# Patient Record
Sex: Female | Born: 1940
Health system: Southern US, Community
[De-identification: ages and names within clinical notes are randomized; demographics above are authoritative.]

## PROBLEM LIST (undated history)

## (undated) DIAGNOSIS — Z5189 Encounter for other specified aftercare: Secondary | ICD-10-CM

## (undated) DIAGNOSIS — F329 Major depressive disorder, single episode, unspecified: Secondary | ICD-10-CM

## (undated) DIAGNOSIS — N189 Chronic kidney disease, unspecified: Secondary | ICD-10-CM

## (undated) DIAGNOSIS — D649 Anemia, unspecified: Secondary | ICD-10-CM

## (undated) DIAGNOSIS — M1990 Unspecified osteoarthritis, unspecified site: Secondary | ICD-10-CM

## (undated) DIAGNOSIS — I10 Essential (primary) hypertension: Secondary | ICD-10-CM

## (undated) DIAGNOSIS — T7840XA Allergy, unspecified, initial encounter: Secondary | ICD-10-CM

## (undated) DIAGNOSIS — F32A Depression, unspecified: Secondary | ICD-10-CM

## (undated) DIAGNOSIS — K635 Polyp of colon: Secondary | ICD-10-CM

## (undated) DIAGNOSIS — F419 Anxiety disorder, unspecified: Secondary | ICD-10-CM

## (undated) DIAGNOSIS — J189 Pneumonia, unspecified organism: Secondary | ICD-10-CM

## (undated) DIAGNOSIS — H269 Unspecified cataract: Secondary | ICD-10-CM

## (undated) DIAGNOSIS — R55 Syncope and collapse: Secondary | ICD-10-CM

## (undated) DIAGNOSIS — M5416 Radiculopathy, lumbar region: Secondary | ICD-10-CM

## (undated) DIAGNOSIS — M810 Age-related osteoporosis without current pathological fracture: Secondary | ICD-10-CM

## (undated) DIAGNOSIS — G709 Myoneural disorder, unspecified: Secondary | ICD-10-CM

## (undated) DIAGNOSIS — J302 Other seasonal allergic rhinitis: Secondary | ICD-10-CM

## (undated) DIAGNOSIS — I4510 Unspecified right bundle-branch block: Secondary | ICD-10-CM

## (undated) DIAGNOSIS — K219 Gastro-esophageal reflux disease without esophagitis: Secondary | ICD-10-CM

## (undated) HISTORY — PX: COLONOSCOPY: SHX174

## (undated) HISTORY — DX: Depression, unspecified: F32.A

## (undated) HISTORY — DX: Major depressive disorder, single episode, unspecified: F32.9

## (undated) HISTORY — PX: POLYPECTOMY: SHX149

## (undated) HISTORY — DX: Unspecified osteoarthritis, unspecified site: M19.90

## (undated) HISTORY — DX: Other seasonal allergic rhinitis: J30.2

## (undated) HISTORY — DX: Encounter for other specified aftercare: Z51.89

## (undated) HISTORY — PX: WISDOM TOOTH EXTRACTION: SHX21

## (undated) HISTORY — DX: Polyp of colon: K63.5

## (undated) HISTORY — DX: Syncope and collapse: R55

## (undated) HISTORY — DX: Age-related osteoporosis without current pathological fracture: M81.0

## (undated) HISTORY — DX: Radiculopathy, lumbar region: M54.16

## (undated) HISTORY — DX: Allergy, unspecified, initial encounter: T78.40XA

## (undated) HISTORY — DX: Unspecified right bundle-branch block: I45.10

## (undated) HISTORY — DX: Essential (primary) hypertension: I10

## (undated) HISTORY — DX: Anxiety disorder, unspecified: F41.9

## (undated) HISTORY — DX: Unspecified cataract: H26.9

## (undated) HISTORY — DX: Gastro-esophageal reflux disease without esophagitis: K21.9

---

## 1979-02-15 HISTORY — PX: VAGINAL HYSTERECTOMY: SUR661

## 1991-02-15 HISTORY — PX: RECTOCELE REPAIR: SHX761

## 1997-09-25 ENCOUNTER — Other Ambulatory Visit: Admission: RE | Admit: 1997-09-25 | Discharge: 1997-09-25 | Payer: Self-pay | Admitting: Internal Medicine

## 1998-04-14 ENCOUNTER — Other Ambulatory Visit: Admission: RE | Admit: 1998-04-14 | Discharge: 1998-04-14 | Payer: Self-pay | Admitting: *Deleted

## 2000-06-06 ENCOUNTER — Other Ambulatory Visit: Admission: RE | Admit: 2000-06-06 | Discharge: 2000-06-06 | Payer: Self-pay | Admitting: *Deleted

## 2000-09-23 ENCOUNTER — Emergency Department (HOSPITAL_COMMUNITY): Admission: EM | Admit: 2000-09-23 | Discharge: 2000-09-23 | Payer: Self-pay | Admitting: Emergency Medicine

## 2000-12-26 ENCOUNTER — Ambulatory Visit (HOSPITAL_COMMUNITY): Admission: RE | Admit: 2000-12-26 | Discharge: 2000-12-27 | Payer: Self-pay | Admitting: Cardiology

## 2001-02-14 HISTORY — PX: CARDIAC CATHETERIZATION: SHX172

## 2003-01-08 ENCOUNTER — Encounter: Admission: RE | Admit: 2003-01-08 | Discharge: 2003-01-08 | Payer: Self-pay | Admitting: Internal Medicine

## 2004-02-15 HISTORY — PX: BACK SURGERY: SHX140

## 2004-06-17 ENCOUNTER — Observation Stay (HOSPITAL_COMMUNITY): Admission: RE | Admit: 2004-06-17 | Discharge: 2004-06-18 | Payer: Self-pay | Admitting: Specialist

## 2004-12-25 ENCOUNTER — Emergency Department (HOSPITAL_COMMUNITY): Admission: EM | Admit: 2004-12-25 | Discharge: 2004-12-25 | Payer: Self-pay | Admitting: Emergency Medicine

## 2006-02-14 HISTORY — PX: TOTAL HIP ARTHROPLASTY: SHX124

## 2006-03-09 ENCOUNTER — Ambulatory Visit: Payer: Self-pay | Admitting: Internal Medicine

## 2006-03-14 ENCOUNTER — Ambulatory Visit: Payer: Self-pay | Admitting: Internal Medicine

## 2006-04-24 ENCOUNTER — Encounter: Admission: RE | Admit: 2006-04-24 | Discharge: 2006-04-24 | Payer: Self-pay | Admitting: Specialist

## 2006-06-07 ENCOUNTER — Encounter: Admission: RE | Admit: 2006-06-07 | Discharge: 2006-06-07 | Payer: Self-pay | Admitting: Specialist

## 2006-08-03 ENCOUNTER — Inpatient Hospital Stay (HOSPITAL_COMMUNITY): Admission: RE | Admit: 2006-08-03 | Discharge: 2006-08-09 | Payer: Self-pay | Admitting: Specialist

## 2009-10-14 ENCOUNTER — Ambulatory Visit (HOSPITAL_COMMUNITY): Admission: RE | Admit: 2009-10-14 | Discharge: 2009-10-14 | Payer: Self-pay | Admitting: Orthopedic Surgery

## 2010-06-29 NOTE — Op Note (Signed)
[name], [name]              ACCOUNT NO.:  [id number]   MEDICAL RECORD NO.:  [id number]          PATIENT TYPE:  INP   LOCATION:  0003                         FACILITY:  [facility]   PHYSICIAN:  Jene Every, M.D.    DATE OF BIRTH:  08/08/1940   DATE OF PROCEDURE:  08/03/2006  DATE OF DISCHARGE:                               OPERATIVE REPORT   PREOPERATIVE DIAGNOSIS:  Degenerative joint disease, right hip.   POSTOPERATIVE DIAGNOSIS:  Degenerative joint disease, right hip.   PROCEDURE PERFORMED:  Right total hip arthroplasty.   ANESTHESIA:  General.   ASSISTANT:  Marlowe Kays, M.D.   COMPONENTS:  DePuy Prodigy 12 femur, 52 hip, +9 ball, 10-degree  posterior lip.   BRIEF HISTORY AND INDICATION:  [age], end-stage osteoarthrosis of  the hip, valgus head and neck.  Operative intervention was indicated for  replacement of degenerated joint.  Risks and benefits discussed  including bleeding, infection, dislocation, leg length discrepancy,  dissociation, need for revision, anesthetic complications, etc.   TECHNIQUE:  The patient in supine position.  After induction of adequate  general anesthesia, 2 g Kefzol, the right lower extremity is prepped and  draped in the usual sterile fashion and the peritrochanteric region as  well.  She was prior to that placed in a hip holder frame.   Peritrochanteric incision was made based the head.  Subcutaneous tissue  was dissected.  Electrocautery was utilized to achieve hemostasis.  Fascia lata identified and divided in the line of the skin incision.  We  placed a Charnley retractor with the soft tissue protected with towels.  Adductor tenotomy performed, the hip internally rotated, bursa excised,  external rotators tagged, detached from their origin, reflected  posteriorly to protect the sciatic nerve throughout the case.  A  capsular tenotomy performed, capsule incised, abundant synovial fluid  evacuated.  Hypertrophic synovitis  removed as well.  The hip was  dislocated approximately a fingerbreadth and a half above the lesser  trochanter due to her valgus angle.  An oscillating saw was utilized to  perform an osteotomy of the femoral neck.  We then entered the femoral  canal, sequentially reaming that after using the initiator to a 12 mm  diameter.  It was then broached to a 12.5 after lateralizing it  appropriately in the appropriate version.  Used a calcar reamer as well.  Next, attention turned towards the acetabulum.  We placed retractors  around the acetabulum, removed the labrum, curetted the acetabulum,  identified the fovea, utilized electrocautery.  We used a reamer and  reamed sequentially from a 40 to a 51 to the depth of the fovea,  medializing this in the appropriate version to good bleeding cancellous  tissue.  No perforation of the medial wall was noted.  We used a trial  cup, appropriate version, 45, 15 in inclination.  The trial femur was  reduced and found to be stable throughout a full range.  Used a +5 ball  at that time.  Attention was turned back toward the acetabulum.  Irrigated it copiously.  Used a Duraloc Tri-Lock cup, inserted in the  appropriate fashion, 45 degrees, 15 degrees of inclination, impacted  into place and it was noted to seat flush with the floor of the  acetabulum.  Attempted to move it with the hand and it was not  dislodging.  I put in two additional screws to secure the cup, drilling  superiorly and posteriorly, felt posteriorly, placed a 25 and a 20 screw  with excellent purchase.  We used a 10-degree lip liner in the 10  o'clock position.  Impacted it checked with the [facility].  Attention was  turned back toward the femur, it was irrigated copiously.  I used a 12  Prodigy, impacted it into place, excellent purchase, seated flush with  the calcar in the appropriate version.  We trialled hip balls and +5 and  +9 appeared to be optimal with excellent stability in flexion  and  external rotation without dislocation.  Full extension with the knee  flexed, internally and externally rotated without dislocation.  Wound  copiously irrigated.  Inspection revealed no evidence of active  bleeding.  Electrocautery had been utilized to achieve strict  hemostasis.  Removed the tag retractor.  We examined the sciatic nerve  and it was found to be intact.  Then repaired the adductor tenotomy with  #1 Vicryl interrupted figure-of-eight sutures.  Fascia lata repaired  with #1 Vicryl interrupted figure-of-eight sutures.  Subcutaneous tissue  approximated layers of 0 and 2-0 Vicryl simple sutures.  The skin was  reapproximated with staples and the wound was dressed sterilely.  She  was placed supine on a hospital bed and a knee immobilizer placed.  Leg  lengths were equivalent, good pulses.  Extubated without difficulty and  transported to the recovery room in satisfactory condition.   The patient tolerated the procedure well with no complications.  Estimated blood loss was 900 mL.      Jene Every, M.D.  Electronically Signed     JB/MEDQ  D:  08/03/2006  T:  08/03/2006  Job:  [id number]

## 2010-06-29 NOTE — Discharge Summary (Signed)
[name], [name]              ACCOUNT NO.:  [id number]   MEDICAL RECORD NO.:  [id number]          PATIENT TYPE:  INP   LOCATION:  1605                         FACILITY:  [facility]   PHYSICIAN:  Jene Every, M.D.    DATE OF BIRTH:  August 27, 1940   DATE OF ADMISSION:  08/03/2006  DATE OF DISCHARGE:  08/09/2006                               DISCHARGE SUMMARY   ADMISSION DIAGNOSES:  1. Degenerative joint disease, right hip.  2. Hypertension.  3. Hypokalemia.  4. Anxiety.  5. Depression.   DISCHARGE DIAGNOSES:  1. Degenerative joint disease, right hip, status post right total hip      arthroplasty.  2. Resolved urinary retention.  3. Resolved constipation.  4. Resolved hypokalemia.  5. Resolved postoperative anemia.  6. Hypertension.  7. Anxiety.  8. Depression.   HISTORY:  Ms. [name] is a pleasant 70 year old female with  longstanding history of hip pain that has become disabling for her.  She  underwent conservative treatment and failed. It was thought at this  point she would benefit from a total hip arthroplasty.  The risks and  benefits of this were discussed with the patient.  She elected to  proceed.   PROCEDURE NOTE:  The patient was taken to the OR on August 03, 2006.  Surgeon: Jene Every, M.D.  Assistant:  Marlowe Kays, M.D.  Did  undergo right total hip arthroplasty.  Anesthesia: General.  Complications:  None.   CONSULTATIONS:  PT, OT, case management.   LABORATORY DATA:  Preoperative CBC showed white cell count 4.9,  hemoglobin 12.4, hematocrit 35.8. These were followed throughout  hospital course.  Postoperatively, the patient's hemoglobin did drop to  a low of 8.2, hematocrit 23.8.  She was transfused 2 units packed red  blood cells which did improve.  At time of discharge, hemoglobin 10.9,  hematocrit 31.5.  White cell count remained normal throughout the  hospital stay.  Postoperatively, the patient placed on Coumadin.  Initial coagulation studies  showed PT of 12.7, INR 0.9, PTT 24.  These  were followed throughout the hospital course.  At the time of discharge,  the patient is therapeutic on Coumadin with INR of 2.2.  Routine  chemistries done preoperatively showed sodium 136, potassium 3.8,  glucose 101.  Normal BUN and creatinine.  These were followed throughout  the hospital course.  The patient's sodium remained normal.  Potassium  did decrease to low of 3.2.  At the time of discharge, potassium is 4.0.  Glucose fluctuated to a high of 152.  At the time of discharge, this was  112.  BUN and creatinine have remained normal.  Calcium levels did  fluctuate throughout the hospital course.  Preoperative urinalysis was  negative.  This was repeated on June 21 which was negative as well for  any bacteria or white cells.  Blood type O negative.  Urine cultures  showed no growth.   Preoperative chest x-ray showed no active disease.   Preoperative films of the hip showed advanced degenerative changes of  the right hip.   Preoperative EKG showed normal sinus rhythm, ST and  T wave abnormalities  noted, questionable anterolateral ischemia which was evaluated  appropriately.   HOSPITAL COURSE:  The patient was admitted, taken to the OR, underwent  the above-stated procedure without difficulty.  She was transferred to  PACU and then to the orthopedic floor for continued postoperative care.  Postoperatively, she was placed on PCA for pain relief.  She did fairly  well.  On postoperative day #1, she was started on Coumadin for DVT  prophylaxis.  The patient progressed slowly with physical therapy.  She  had difficulties with voiding. She was started on Flomax.  Unfortunately, a catheter had to be inserted again on postoperative day  #2.  She also had a drop in her hemoglobin on postoperative day #2 to  8.9, hematocrit 25.8.  She had a temperature of 100.8, pulse 105.  Hemoglobin was repeated on postop day #3. She was slightly dizzy and  a  little tachycardic.  Hemoglobin dropped to a low of 8.2.  It was felt at  this point she would benefit from 2 units of packed red blood cells.  Dressing was changed.  The incision was clean and dry.  There was no  erythema or evidence of infection.  Compartments were soft.  She also  had slight hypokalemia on postoperative day #3.  This was supplemented  with p.o. K-Dur.  She was continued on Flomax.  She also had complaints  of constipation and was treated appropriately.   On postoperative day #5, she was voiding without difficulty.  She had a  bowel movement without difficulty as well.  She had some increased  swelling in the right lower extremity, but this was secondary to  increased activity.  She was progressing well with physical therapy.  At  this point, discharge planning was initiated with plans for discharge  tomorrow to assisted living. She did have resolution of her  postoperative anemia as well as hypokalemia.   On postoperative day #6, the patient was doing very well.  She had met  all goals of physical therapy.  It was thought she would be stable to be  discharged to assisted living. She was voiding without difficulty.  Pain  was controlled on p.o. analgesics. She was afebrile.  Vital signs were  stable.  She had normal potassium and sodium.  Incision was clean and  dry with no evidence of infection.   DISPOSITION:  Patient discharged to [facility].   DIET:  As tolerated.   ACTIVITY:  She is to be partial weightbearing on the right lower  extremity.  Total hip precautions should be implemented.  She should use  knee immobilizer when in bed to avoid dislocation.   DISCHARGE MEDICATIONS:  1. Metoprolol ER 50 mg 1/2 tablet p.o. q.a.m.  2. Potassium 20 mEq 1 p.o. b.i.d.  3. Lorazepam 0.5 mg 1 p.o. nightly.  4. Paxil 40 mg 1 p.o. q.a.m.  5. Diovan/hydrochlorothiazide 160/25 two tablets p.o. q.a.m.  6. Benadryl 25 mg1 p.o. nightly.  7. Multivitamins  nightly.  8. Calcium 500 Plus D two nightly.  9. Coumadin dosed per pharmacy.  10.Colace 100 mg 1 p.o. b.i.d.  11.Norco 5/325 one to two p.o. q. 4-6 h p.r.n. pain.  12.Vitamin C 500 mg daily.   FOLLOWUP:  She will follow up with Dr. Shelle Iron in approximately 10 days  for staple removal and x-ray.   CONDITION ON DISCHARGE:  Stable.   FINAL DIAGNOSIS:  Status post right total hip arthroplasty.  Roma Schanz, P.A.      Jene Every, M.D.  Electronically Signed    CS/MEDQ  D:  08/09/2006  T:  08/09/2006  Job:  [id number]

## 2010-06-29 NOTE — H&P (Signed)
[name], [name]              ACCOUNT NO.:  [id number]   MEDICAL RECORD NO.:  [id number]          PATIENT TYPE:  INP   LOCATION:  NA                           FACILITY:  [facility]   PHYSICIAN:  Jene Every, M.D.    DATE OF BIRTH:  Nov 21, 1940   DATE OF ADMISSION:  08/03/2006  DATE OF DISCHARGE:                              HISTORY & PHYSICAL   CHIEF COMPLAINT:  Right hip pain.   HISTORY:  Ms. Grandmaison is a pleasant 70 year old female who has a  previous history of lumbar decompression.  She did fairly well from  this, but in February of this year she presented to the office with  recurrent right lower extremity pain, worse with ambulation. Pain noted  mostly in the groin area.  She noted fairly disabling symptoms and  inability to do most of her daily activities. The patient on exam had  painful range of motion of the hip and pain with axial loading.  She had  loss of range of motion with internal and external rotation of the hip.  She has negative straight leg raise bilaterally.   X-rays at that time revealed end-stage osteoarthrosis of the right hip.  Dr. Shelle Iron at that time did discuss treatment options with the patient,  indicating she would probably require a total joint replacement.  The  patient contemplated her options and returned to the office in April and  decided she wanted to proceed with a total hip replacement.  The risks  and benefits of this were discussed with the patient.  We did obtain  preoperative clearance, and surgery was scheduled.   PAST MEDICAL HISTORY:  1. Hypertension.  2. History of chronic cystitis.  3. Hypokalemia secondary to diuretics.   CURRENT MEDICATIONS:  1. Toprol 75 mg one p.o. daily.  2. Paxil 40 mg one p.o. daily.  3. Calcium 600 mg 2 tablets daily.  4. Vitamin B complex daily.  5. K-Dur 20 mEq daily.  6. Diovan/HCT 320/50 one p.o. daily.  7. Multivitamins daily.  8. The patient is currently completing a course of Cipro 5 mg  b.i.d.,      this completed on June 10.  9. The patient also takes Ativan 0.5 mg one p.o. nightly.   ALLERGIES:  CLEOCIN.   PAST SURGICAL HISTORY:  1. Hysterectomy in 1982.  2. Bladder tack.  3. Repair of a rectocele.  4. Previous lumbar decompression.   SOCIAL HISTORY:  The patient is married.  Her husband is currently  undergoing multiple medical issues and is significantly disabled at this  time. She is retired.  She has no history of tobacco or alcohol  consumption.  She has three children.  She will need to be admitted to a  skilled nursing facility more than likely postoperatively.  Primary care  physician is Dr. Earl Gala at [facility]   REVIEW OF SYSTEMS:  GENERAL: The patient denies any fever, chills, night  sweats or bleeding tendencies.  CNS: No blurred or double vision,  seizure, headache, paralysis.  RESPIRATORY: No shortness of breath,  productive cough or hemoptysis.  CARDIOVASCULAR: No  chest pain, angina,  orthopnea.  GU: The patient has had recent urinary tract infection that  is improving. No discharge noted, no kidney symptoms noted at time of  exam.  GI: The patient has had mild nausea secondary to antibiotics.  This has been recently. No vomiting, no diarrhea, constipation or melena  or bloody stools.  MUSCULOSKELETAL:  As per HPI.   PHYSICAL EXAMINATION:  VITAL SIGNS:  Pulse is 76, respiratory rate 6,  blood pressure 130/84.  GENERAL: This is a well-developed, well-nourished female sitting upright  in no acute distress.  She does walk with an antalgic gait.  She is  slightly anxious in nature.  HEENT:  Atraumatic, normocephalic.  Pupils equal round and reactive to  light.  EOMs intact.  NECK: Supple.  No lymphadenopathy.  No carotid bruits are noted.  She  had negative carotid study recently.  CHEST:  No rhonchi, wheezes or rales.  Clear to auscultation  bilaterally.  BREASTS/GU:  Not pertinent HPI.  HEART: Regular rate and rhythm without  murmurs, gallops or rubs.  ABDOMEN:  Soft, nontender, nondistended.  Bowel sounds x4.  SKIN: No rashes or lesions are noted.  EXTREMITIES:  The patient does have pain with range of motion of the  right hip.  She has decrease in internal and external rotation. She has  negative straight leg raise.  Sensation is intact to lower extremity.   IMPRESSION:  End-stage degenerative changes of the right hip.   PLAN:  The patient will be admitted to [facility] to undergo a  right total hip arthroplasty.      Roma Schanz, P.A.      Jene Every, M.D.  Electronically Signed    CS/MEDQ  D:  07/26/2006  T:  07/26/2006  Job:  [id number]

## 2010-07-02 NOTE — Cardiovascular Report (Signed)
[place]. [facility]  Patient:    [name], [name] Visit Number: [id number] MRN: [id number]          Service Type: CAT Location: 3700 [id number] Attending Physician:  Armanda Magic Dictated by:   Armanda Magic, M.D. Proc. Date: 12/26/00 Admit Date:  12/26/2000   CC:         Theressa Millard, M.D.   Cardiac Catheterization  REFERRING PHYSICIAN:  Theressa Millard, M.D.  PROCEDURE:  Left heart catheterization, coronary angiography, left ventriculography.  SURGEON:  Armanda Magic, M.D.  INDICATION:  Syncope and abnormal stress Cardiolite study.  COMPLICATIONS:  None.  IV ACCESS:  Via right femoral artery 6 French sheath.  BRIEF HISTORY:  This is a 70 year old white female who recently had a syncopal episode while walking from the cafeteria back to her classroom.  Initial workup showed a normal 2-D echocardiogram with no region wall motion abnormalities.  Neurologic workup was normal.  Event monitor was normal.  She did have stress Cardiolite study done at [facility] which showed multiple perfusion defects that were fixed.  No evidence of ischemia, possible previous infarct.  She now presents for cardiac catheterization.  DESCRIPTION OF PROCEDURE:  The patient is brought to the cardiac catheterization in the fasting, nonsedated state.  Informed consent was obtained.  The patient was connected to the continuous heart rate and pulse oximeter monitor and blood pressure monitor.  The right groin was prepped and draped in the sterile fashion.  Xylocaine 1% was used for local anesthesia.  Using the modified Seldinger technique, a 6 [place] was placed in the right femoral artery.  Under fluoroscopic guidance, a 6 [place] JL-4 catheter was placed in the left coronary artery.  Multiple cine films were taken in a 30 degree RAO and 40 degree LAO view.  This catheter was then exchanged out over a guide wire for a 6 [place] JR-4 catheter which is placed in the  right coronary artery under fluoroscopic guidance.  Multiple cine films were taken in 30 degree in RAO and 40 degree LAO views.  This catheter was then exchanged out over a guide wire for a 6 French angle pigtail catheter.  This catheter was placed in a left ventricular cavity. Left ventriculography was performed in a 30 degree RAO view using a total of 30 cc of contrast at 13 cc per second.  The catheter was then pulled back across the aortic valve with no significant gradient noted.  At the end of the procedure, all catheters and sheaths were removed.  Manual compression was performed until adequate hemostasis was obtained.  The patient was transferred back to the room in stable condition.  RESULTS:  Angiographic data: 1. The left main coronary artery is widely patent and bifurcates into the    left anterior descending coronary artery and left circumflex artery. 2. The left anterior descending coronary artery is widely patent throughout    the course.  It gives rise to one large diagonal branch with extremely    large vessel and with multiple branches.  It is widely patent. 3. The left circumflex gives rise to a very high obtuse marginal branch    which is widely patent and then transverses the AV groove distally giving    rise to a second obtuse marginal branch which was widely patent. 4. The right coronary artery gives rise to a very high acute marginal branch,    which is widely patent.  The right coronary artery is widely patent  throughout the course and bifurcates distally into the posterior    descending artery and posterolateral artery.  Left ventriculography:  Left ventriculography performed in a 30 degree RAO view using a total of 30 cc of contrast at 13 cc per second showed normal left ventricular size and systolic function, mild MR.  ASSESSMENT: 1. Syncope. 2. Normal coronary arteries. 3. Normal left ventricular function.  PLAN:  IV fluid hydration, discharge to  home later today, follow up with Dr. Armanda Magic in two weeks. Dictated by:   Armanda Magic, M.D. Attending Physician:  Armanda Magic DD:  12/26/00 TD:  12/26/00 Job: [id number]

## 2010-07-02 NOTE — Procedures (Signed)
[place]. [facility]  Patient:    [name], [name] Visit Number: [id number] MRN: [id number]          Service Type: EMS Location: ED Attending Physician:  Donnetta Hutching Dictated by:   Armanda Magic, M.D. Admit Date:  09/23/2000 Discharge Date: 09/23/2000                             Procedure Report  PROCEDURE:  Tilt table testing.  OPERATOR:  Armanda Magic, M.D.  INDICATIONS:  Syncope.  HISTORY:  This is a 70 year old white female with a history of syncopal episodes.  She now presents for tilt table testing.  PROCEDURE:  The patient was brought to the cardiac catheterization laboratory in the retrophysiology laboratory in the fasting, nonsedated state.  An informed consent was obtained.  The patient was connected to continuous heart rate and pulse oximetry monitoring and intermittent blood pressure monitoring. Baseline blood pressure was monitored in a supine position for four minutes. The baseline blood pressure was 160/82-175/75 with a heart rate of 68-77. The patient was then tilted upright to 70 degrees for a total of 45 minutes. Her lowest blood pressure achieved was 145/102.  The lowest diastolic blood pressure achieved appeared to be 80 mmHg.  The heart rate ranged anywhere from 70-90. The patient had no symptoms during the procedure.  IMPRESSION: 1. Syncope. 2. Negative tilt table test.  PLAN: Followup with Armanda Magic, M.D., in two weeks. Dictated by:   Armanda Magic, M.D. Attending Physician:  Donnetta Hutching DD:  12/08/00 TD:  12/11/00 Job: [id number]

## 2010-07-02 NOTE — Op Note (Signed)
[name], [name]              ACCOUNT NO.:  [id number]   MEDICAL RECORD NO.:  [id number]          PATIENT TYPE:  AMB   LOCATION:  DAY                          FACILITY:  [facility]   PHYSICIAN:  Jene Every, M.D.    DATE OF BIRTH:  Jun 20, 1940   DATE OF PROCEDURE:  06/17/2004  DATE OF DISCHARGE:                                 OPERATIVE REPORT   PREOPERATIVE DIAGNOSES:  1.  Spinal stenosis.  2.  Herniated nucleus pulposus L4-5 right.   POSTOPERATIVE DIAGNOSES:  1.  Spinal stenosis.  2.  Herniated nucleus pulposus L4-5 right.   PROCEDURE:  Lateral recess decompression, micro discectomy L4-5 right,  foraminotomy L5.   ANESTHESIA:  General anesthesia.   ASSISTANT:  Roma Schanz, P.A.   BRIEF HISTORY AND INDICATION:  The patient is a 70 year old with L5  radiculopathy secondary lateral recess stenosis, multifactorial.  Positive  neural tension signs, EHL weakness, had temporary relief from an epidural  steroid injection.  Due to the persistence of her symptoms, positive neural-  tension signs, EHL weakness, she was indicated for decompression of lateral  recess.  Risks and benefits were discussed including bleeding, infection,  damage to neurovascular structures, CSF leakage, epidural fibrosis, adjacent  segment disease, need for fusion in the future, etc.   DESCRIPTION OF PROCEDURE:  Patient in supine position.  After the induction  of adequate general anesthesia, 1 g Kefzol, she was placed prone on the  [place] frame.  All bony prominences well padded.  Lumbar region prepped and  draped in the usual sterile fashion.  A Tuohy 18-gauge spinal needle was  utilized to localize the 4-interspace and confirmed with an x-ray.  Incision  made from spinous process of 4 to 5, subcutaneous tissue was dissected.  Electrocautery utilized to achieve hemostasis.  Thoracolumbar fascia  identified and divided in line with the skin incision.  Paraspinous muscle  elevated from lamina of 4  and 5.  Segment confirmatory radiograph obtained  with Penfield IV in the interlaminar space.  Hemilaminotomy in the caudate  edge was performed with a 2 and 3 mm Kerrison detaching the ligamentum  flavum.  There was hypertrophic ligamentum flavum and facet arthropathy  pressing lateral recess.  Ligamentum flavum detachment from cephalad edge of  5 with 2 and 3 mm Kerrison.  Foraminotomy of L5 was performed and lateral  recess was decompressed from the medial border of the pedicle.  Compression  upon the L5 nerve root therefore was relieved.  Foraminotomy had been  performed as well.  It gently mobilized the nerve root medially.  Small  focal HNP was noted.  Annulotomy was performed.  Small disc herniation was  treated with straight and up biting pituitary.  Disc degeneration was noted  and we confirmed the space with a radiograph laterally, Penfield IV in the  interlaminar space.  Next, the probe placed in the foramina, 4 and 5 found  to be widely patent.  We checked on the axilla and beneath the thecal sac,  there was no residual compression upon the root.  There was at least a  centimeter of  free excursion medial to the pedicle.  Disc space was  copiously irrigated with antibiotic irrigation, thrombin soaked Gelfoam  placed in laminotomy defect. McCullough retractor removed.  Paraspinous  muscles inspected.  No evidence of active bleeding.  Dorsal and fascia  reapproximated with #1 Vicryl interrupted figure-of-eight suture.  Subcutaneous tissue reapproximated with 0 and 2-0 Vicryl simple sutures.  Skin reapproximated with four subcuticular chromium.  Wound reinforced with  Steri-Strips.  Sterile dressing applied.  Placed supine on hospital bed,  extubated without difficulty and transported to the recovery room in  satisfactory condition.   Patient tolerated the procedure without complications.  Minimal blood loss.      JB/MEDQ  D:  06/17/2004  T:  06/17/2004  Job:  [id number]

## 2010-12-01 LAB — CBC
HCT: 23.8 — ABNORMAL LOW
HCT: 30 — ABNORMAL LOW
HCT: 31 — ABNORMAL LOW
Hemoglobin: 10.5 — ABNORMAL LOW
Hemoglobin: 10.5 — ABNORMAL LOW
Hemoglobin: 8.2 — ABNORMAL LOW
Hemoglobin: 8.9 — ABNORMAL LOW
MCHC: 34.5
MCHC: 34.6
MCHC: 34.6
MCHC: 34.6
MCHC: 34.9
MCV: 87.5
MCV: 87.9
MCV: 87.9
MCV: 88.3
Platelets: 219
Platelets: 274
Platelets: 326
RBC: 3.53 — ABNORMAL LOW
RDW: 12.1
RDW: 12.9
RDW: 13.9
WBC: 7.4
WBC: 8.3

## 2010-12-01 LAB — TYPE AND SCREEN: ABO/RH(D): O NEG

## 2010-12-01 LAB — BASIC METABOLIC PANEL
BUN: 3 — ABNORMAL LOW
BUN: 3 — ABNORMAL LOW
BUN: 6
BUN: 7
BUN: 7
CO2: 28
CO2: 29
CO2: 29
CO2: 29
Calcium: 8 — ABNORMAL LOW
Calcium: 8.7
Chloride: 100
Chloride: 102
Chloride: 104
Chloride: 105
Creatinine, Ser: 0.49
Creatinine, Ser: 0.63
GFR calc Af Amer: >60
GFR calc Af Amer: >60
GFR calc Af Amer: >60
GFR calc Af Amer: >60
GFR calc non Af Amer: >60
Glucose, Bld: 112 — ABNORMAL HIGH
Glucose, Bld: 113 — ABNORMAL HIGH
Potassium: 3.2 — ABNORMAL LOW
Potassium: 3.2 — ABNORMAL LOW
Potassium: 3.5
Potassium: 4.1
Sodium: 136
Sodium: 138
Sodium: 138

## 2010-12-01 LAB — PROTIME-INR
INR: 1.1
INR: 1.5
INR: 1.8 — ABNORMAL HIGH
INR: 2.2 — ABNORMAL HIGH
Prothrombin Time: 18.5 — ABNORMAL HIGH
Prothrombin Time: 21.4 — ABNORMAL HIGH
Prothrombin Time: 25.4 — ABNORMAL HIGH

## 2010-12-01 LAB — URINALYSIS, ROUTINE W REFLEX MICROSCOPIC
Bilirubin Urine: NEGATIVE
Glucose, UA: NEGATIVE
Nitrite: NEGATIVE
Specific Gravity, Urine: 1.011
Urobilinogen, UA: 0.2

## 2010-12-01 LAB — URINE CULTURE: Special Requests: NEGATIVE

## 2010-12-01 LAB — PREPARE RBC (CROSSMATCH)

## 2010-12-02 LAB — CBC
HCT: 35.8 — ABNORMAL LOW
Hemoglobin: 12.4
MCHC: 34.6
Platelets: 381
RDW: 12.8

## 2010-12-02 LAB — BASIC METABOLIC PANEL
BUN: 22
CO2: 27
GFR calc non Af Amer: >60
Glucose, Bld: 101 — ABNORMAL HIGH
Potassium: 3.8
Sodium: 136

## 2010-12-02 LAB — URINALYSIS, ROUTINE W REFLEX MICROSCOPIC
Bilirubin Urine: NEGATIVE
Hgb urine dipstick: NEGATIVE
Ketones, ur: NEGATIVE
Nitrite: NEGATIVE
Specific Gravity, Urine: 1.021
Urobilinogen, UA: 0.2
pH: 6

## 2011-03-30 ENCOUNTER — Encounter: Payer: Self-pay | Admitting: Internal Medicine

## 2011-05-23 ENCOUNTER — Encounter: Payer: Self-pay | Admitting: Internal Medicine

## 2011-06-28 ENCOUNTER — Telehealth: Payer: Self-pay | Admitting: *Deleted

## 2011-06-28 NOTE — Telephone Encounter (Signed)
No show for previsit/no answer at home number/sent no show letter

## 2011-06-29 ENCOUNTER — Encounter: Payer: Self-pay | Admitting: Internal Medicine

## 2011-07-12 ENCOUNTER — Encounter: Payer: Self-pay | Admitting: Internal Medicine

## 2011-08-24 ENCOUNTER — Ambulatory Visit (AMBULATORY_SURGERY_CENTER): Payer: Medicare Other | Admitting: *Deleted

## 2011-08-24 VITALS — Ht 66.0 in | Wt 194.5 lb

## 2011-08-24 DIAGNOSIS — Z800 Family history of malignant neoplasm of digestive organs: Secondary | ICD-10-CM

## 2011-08-24 DIAGNOSIS — Z86010 Personal history of colonic polyps: Secondary | ICD-10-CM

## 2011-08-24 DIAGNOSIS — Z1211 Encounter for screening for malignant neoplasm of colon: Secondary | ICD-10-CM

## 2011-08-24 MED ORDER — MOVIPREP 100 G PO SOLR: ORAL | Status: DC

## 2011-08-31 ENCOUNTER — Ambulatory Visit (AMBULATORY_SURGERY_CENTER): Payer: Medicare Other | Admitting: Internal Medicine

## 2011-08-31 ENCOUNTER — Encounter: Payer: Self-pay | Admitting: Internal Medicine

## 2011-08-31 VITALS — BP 133/82 | HR 61 | Temp 96.6°F | Resp 19 | Ht 66.0 in | Wt 194.0 lb

## 2011-08-31 DIAGNOSIS — Z800 Family history of malignant neoplasm of digestive organs: Secondary | ICD-10-CM

## 2011-08-31 DIAGNOSIS — Z86010 Personal history of colonic polyps: Secondary | ICD-10-CM

## 2011-08-31 DIAGNOSIS — Z1211 Encounter for screening for malignant neoplasm of colon: Secondary | ICD-10-CM

## 2011-08-31 MED ORDER — SODIUM CHLORIDE 0.9 % IV SOLN: 500.0000 mL | INTRAVENOUS | Status: DC

## 2011-08-31 NOTE — Progress Notes (Signed)
Patient did not experience any of the following events: a burn prior to discharge; a fall within the facility; wrong site/side/patient/procedure/implant event; or a hospital transfer or hospital admission upon discharge from the facility. (G8907) Patient did not have preoperative order for IV antibiotic SSI prophylaxis. (G8918)  

## 2011-08-31 NOTE — Patient Instructions (Addendum)
Impressions/recommendations:  Internal hemorrhoids (handout given) Diverticulosis (handout given)  High Fiber Diet (handout given)  Repeat colonoscopy in 5 years.  YOU HAD AN ENDOSCOPIC PROCEDURE TODAY AT THE [facility]: Refer to the procedure report that was given to you for any specific questions about what was found during the examination.  If the procedure report does not answer your questions, please call your gastroenterologist to clarify.  If you requested that your care partner not be given the details of your procedure findings, then the procedure report has been included in a sealed envelope for you to review at your convenience later.  YOU SHOULD EXPECT: Some feelings of bloating in the abdomen. Passage of more gas than usual.  Walking can help get rid of the air that was put into your GI tract during the procedure and reduce the bloating. If you had a lower endoscopy (such as a colonoscopy or flexible sigmoidoscopy) you may notice spotting of blood in your stool or on the toilet paper. If you underwent a bowel prep for your procedure, then you may not have a normal bowel movement for a few days.  DIET: Your first meal following the procedure should be a light meal and then it is ok to progress to your normal diet.  A half-sandwich or bowl of soup is an example of a good first meal.  Heavy or fried foods are harder to digest and may make you feel nauseous or bloated.  Likewise meals heavy in dairy and vegetables can cause extra gas to form and this can also increase the bloating.  Drink plenty of fluids but you should avoid alcoholic beverages for 24 hours.  ACTIVITY: Your care partner should take you home directly after the procedure.  You should plan to take it easy, moving slowly for the rest of the day.  You can resume normal activity the day after the procedure however you should NOT DRIVE or use heavy machinery for 24 hours (because of the sedation medicines used during  the test).    SYMPTOMS TO REPORT IMMEDIATELY: A gastroenterologist can be reached at any hour.  During normal business hours, 8:30 AM to 5:00 PM Monday through Friday, call [phone].  After hours and on weekends, please call the GI answering service at [phone] who will take a message and have the physician on call contact you.   Following lower endoscopy (colonoscopy or flexible sigmoidoscopy):  Excessive amounts of blood in the stool  Significant tenderness or worsening of abdominal pains  Swelling of the abdomen that is new, acute  Fever of 100F or higher  Following upper endoscopy (EGD)  Vomiting of blood or coffee ground material  New chest pain or pain under the shoulder blades  Painful or persistently difficult swallowing  New shortness of breath  Fever of 100F or higher  Black, tarry-looking stools  FOLLOW UP: If any biopsies were taken you will be contacted by phone or by letter within the next 1-3 weeks.  Call your gastroenterologist if you have not heard about the biopsies in 3 weeks.  Our staff will call the home number listed on your records the next business day following your procedure to check on you and address any questions or concerns that you may have at that time regarding the information given to you following your procedure. This is a courtesy call and so if there is no answer at the home number and we have not heard from you through the emergency physician  on call, we will assume that you have returned to your regular daily activities without incident.  SIGNATURES/CONFIDENTIALITY: You and/or your care partner have signed paperwork which will be entered into your electronic medical record.  These signatures attest to the fact that that the information above on your After Visit Summary has been reviewed and is understood.  Full responsibility of the confidentiality of this discharge information lies with you and/or your care-partner.

## 2011-08-31 NOTE — Op Note (Signed)
 [facility] [address] [place], [place]  [address]  COLONOSCOPY PROCEDURE REPORT  PATIENT:  [name], [name]  MR#:  [id number] BIRTHDATE:  1941/01/22, 71 yrs. old  GENDER:  female ENDOSCOPIST:  Hedwig Morton. Juanda Chance, MD REF. BY:  Theressa Millard, M.D. PROCEDURE DATE:  08/31/2011 PROCEDURE:  Colonoscopy 19147 ASA CLASS:  Class II INDICATIONS:  family history of colon cancer, history of pre-cancerous (adenomatous) colon polyps father with colon cancer  aden. polyp 2005 MEDICATIONS:   MAC sedation, administered by CRNA, propofol (Diprivan) 300 mg  DESCRIPTION OF PROCEDURE:   After the risks and benefits and of the procedure were explained, informed consent was obtained. Digital rectal exam was performed and revealed no rectal masses. The LB PCF-[id number] endoscope was introduced through the anus and advanced to the cecum, which was identified by both the appendix and ileocecal valve.  The quality of the prep was good, using MoviPrep.  The instrument was then slowly withdrawn as the colon was fully examined. <<PROCEDUREIMAGES>>  FINDINGS:  Internal Hemorrhoids were found (see image8 and image7).  Scattered diverticula were found in the sigmoid colon (see image2 and image1).  This was otherwise a normal examination of the colon (see image3, image4, and image5).   Retroflexed views in the rectum revealed no abnormalities.    The scope was then withdrawn from the patient and the procedure completed.  COMPLICATIONS:  None ENDOSCOPIC IMPRESSION: 1) Internal hemorrhoids 2) Diverticula, scattered in the sigmoid colon 3) Otherwise normal examination RECOMMENDATIONS: 1) High fiber diet.  REPEAT EXAM:  In 5 year(s) for.  ______________________________ Hedwig Morton. Juanda Chance, MD  CC:  n. eSIGNED:   Hedwig Morton. Quinterrius Errington at 08/31/2011 11:44 AM  Ladean Raya, [phone]

## 2011-09-01 ENCOUNTER — Telehealth: Payer: Self-pay | Admitting: *Deleted

## 2011-09-01 NOTE — Telephone Encounter (Signed)
  Follow up Call-  Call back number 08/31/2011  Post procedure Call Back phone  # [phone]  Permission to leave phone message Yes     Patient questions:  Do you have a fever, pain , or abdominal swelling? no Pain Score  0 *  Have you tolerated food without any problems? yes  Have you been able to return to your normal activities? yes  Do you have any questions about your discharge instructions: Diet   no Medications  no Follow up visit  no  Do you have questions or concerns about your Care? no  Actions: * If pain score is 4 or above: No action needed, pain <4.

## 2011-11-21 ENCOUNTER — Encounter: Payer: Self-pay | Admitting: Internal Medicine

## 2011-12-03 ENCOUNTER — Other Ambulatory Visit: Payer: Self-pay | Admitting: Specialist

## 2011-12-06 ENCOUNTER — Encounter (HOSPITAL_COMMUNITY): Payer: Self-pay | Admitting: Pharmacy Technician

## 2011-12-08 ENCOUNTER — Other Ambulatory Visit: Payer: Self-pay | Admitting: Physician Assistant

## 2011-12-08 NOTE — H&P (Signed)
CC: left hip pain HPI:  71  Y/o  female   With worsening left hip pain secondary to osteoarthritis. Patient has elected to have a total hip arthroplasty to decrease pain and increase function. PMH: hypertension, anxiety, depression Family History:negative Social:non smoker, no ETOH, no illicit drugs Meds: diovan HCT, paxil, toprol XL, potassium, motrin, norco, lorazepam, calcium, vitamins, fish oil Allergies: cipro, cleocin, citric acid ROS: Pain with ambulation Vitals: 118/68 70 12 PE: Alert and appropriate 71 y/o female   In no acute distress Cranial 2-12 grossly intact Cervical spine full rom without pain Lungs: bilateral breath sounds with no wheeze rhonchi or rales Heart: regular rate and rhythm with no murmur Abd: nontender nondistended with active bowel sounds Ext: moderate pain with rom of left hip, antalgic gait, neurovascularly intact distally. No pedal edema Pain with external and internal rotation Skin: no rashes X-rays: endstage osteoarthritis to left hip A/P: endstage osteoarthritis to left hip Plan for total hip arthroplasty to decrease pain and increase function.  

## 2011-12-09 NOTE — Patient Instructions (Addendum)
20 [name]  12/09/2011   Your procedure is scheduled on:  12/16/11 115pm-325pm  Report to [facility] at 1045 AM.  Call this number if you have problems the morning of surgery: [phone]   Remember:   Do not eat food:After Midnight.  May have clear liquids:until 0700am then npo .  Clear liquids include soda, tea, black coffee, apple or grape juice, broth.  Take these medicines the morning of surgery with A SIP OF WATER:    Do not wear jewelry, make-up or nail polish.  Do not wear lotions, powders, or perfumes.   Do not shave 48 hours prior to surgery  Do not bring valuables to the hospital.  Contacts, dentures or bridgework may not be worn into surgery.  Leave suitcase in the car. After surgery it may be brought to your room.  For patients admitted to the hospital, checkout time is 11:00 AM the day of discharge.              SEE CHG INSTRUCTION SHEET    Please read over the following fact sheets that you were given: MRSA Information, coughing and deep breathing exercises, leg exercises, Blood Transfusion Fact Sheet, Incentive Spirometry Fact Sheet

## 2011-12-12 ENCOUNTER — Encounter (HOSPITAL_COMMUNITY)
Admission: RE | Admit: 2011-12-12 | Discharge: 2011-12-12 | Disposition: A | Discharge status: Home / Self Care | Payer: Medicare Other | Source: Ambulatory Visit | Attending: Specialist | Admitting: Specialist

## 2011-12-12 ENCOUNTER — Ambulatory Visit (HOSPITAL_COMMUNITY)
Admission: RE | Admit: 2011-12-12 | Discharge: 2011-12-12 | Disposition: A | Discharge status: Home / Self Care | Payer: Medicare Other | Source: Ambulatory Visit | Attending: Specialist | Admitting: Specialist

## 2011-12-12 ENCOUNTER — Encounter (HOSPITAL_COMMUNITY): Payer: Self-pay

## 2011-12-12 DIAGNOSIS — M169 Osteoarthritis of hip, unspecified: Secondary | ICD-10-CM | POA: Insufficient documentation

## 2011-12-12 DIAGNOSIS — Z01810 Encounter for preprocedural cardiovascular examination: Secondary | ICD-10-CM | POA: Insufficient documentation

## 2011-12-12 DIAGNOSIS — K449 Diaphragmatic hernia without obstruction or gangrene: Secondary | ICD-10-CM | POA: Insufficient documentation

## 2011-12-12 DIAGNOSIS — Z01812 Encounter for preprocedural laboratory examination: Secondary | ICD-10-CM | POA: Insufficient documentation

## 2011-12-12 DIAGNOSIS — Z01818 Encounter for other preprocedural examination: Secondary | ICD-10-CM | POA: Insufficient documentation

## 2011-12-12 DIAGNOSIS — M1610 Unilateral primary osteoarthritis, unspecified hip: Secondary | ICD-10-CM | POA: Insufficient documentation

## 2011-12-12 HISTORY — DX: Anemia, unspecified: D64.9

## 2011-12-12 HISTORY — DX: Myoneural disorder, unspecified: G70.9

## 2011-12-12 HISTORY — DX: Chronic kidney disease, unspecified: N18.9

## 2011-12-12 HISTORY — DX: Pneumonia, unspecified organism: J18.9

## 2011-12-12 LAB — COMPREHENSIVE METABOLIC PANEL
AST: 20 U/L (ref 0–37)
CO2: 29 mEq/L (ref 19–32)
Chloride: 95 mEq/L — ABNORMAL LOW (ref 96–112)
Creatinine, Ser: 0.62 mg/dL (ref 0.50–1.10)
GFR calc Af Amer: >90 mL/min (ref >90)
GFR calc non Af Amer: 89 mL/min — ABNORMAL LOW (ref >90)
Glucose, Bld: 92 mg/dL (ref 70–99)
Total Bilirubin: 0.2 mg/dL — ABNORMAL LOW (ref 0.3–1.2)

## 2011-12-12 LAB — URINALYSIS, ROUTINE W REFLEX MICROSCOPIC
Bilirubin Urine: NEGATIVE
Glucose, UA: NEGATIVE mg/dL
Ketones, ur: NEGATIVE mg/dL
Nitrite: POSITIVE — AB
pH: 7 (ref 5.0–8.0)

## 2011-12-12 LAB — CBC
HCT: 36.9 % (ref 36.0–46.0)
Hemoglobin: 12.8 g/dL (ref 12.0–15.0)
MCV: 85.8 fL (ref 78.0–100.0)
Platelets: 314 10*3/uL (ref 150–400)
RBC: 4.3 MIL/uL (ref 3.87–5.11)
WBC: 6.3 10*3/uL (ref 4.0–10.5)

## 2011-12-12 LAB — URINE MICROSCOPIC-ADD ON

## 2011-12-12 LAB — PROTIME-INR: INR: 0.91 (ref 0.00–1.49)

## 2011-12-12 LAB — APTT: aPTT: 29 seconds (ref 24–37)

## 2011-12-12 NOTE — Progress Notes (Signed)
09/05/11 last office visit  Note with Dr Theressa Millard on chart  Cath report 2002 on chart  EKG 2007 on chart  Cerebrovascular evaluation 12/06/2000 on chart

## 2011-12-14 LAB — URINE CULTURE

## 2011-12-15 MED ORDER — CEFAZOLIN SODIUM-DEXTROSE 2-3 GM-% IV SOLR
2.0000 g | INTRAVENOUS | Status: AC
  Administered 2011-12-16: 2 g via INTRAVENOUS

## 2011-12-16 ENCOUNTER — Inpatient Hospital Stay (HOSPITAL_COMMUNITY): Payer: Medicare Other

## 2011-12-16 ENCOUNTER — Inpatient Hospital Stay (HOSPITAL_COMMUNITY)
Admission: RE | Admit: 2011-12-16 | Discharge: 2011-12-20 | DRG: 470 | Disposition: A | Discharge status: Skilled Nursing Facility | Payer: Medicare Other | Source: Ambulatory Visit | Attending: Specialist | Admitting: Specialist

## 2011-12-16 ENCOUNTER — Encounter (HOSPITAL_COMMUNITY)
Admission: RE | Disposition: A | Discharge status: Skilled Nursing Facility | Payer: Self-pay | Source: Ambulatory Visit | Attending: Specialist

## 2011-12-16 ENCOUNTER — Encounter (HOSPITAL_COMMUNITY): Payer: Self-pay | Admitting: Anesthesiology

## 2011-12-16 ENCOUNTER — Inpatient Hospital Stay (HOSPITAL_COMMUNITY): Payer: Medicare Other | Admitting: Anesthesiology

## 2011-12-16 ENCOUNTER — Encounter (HOSPITAL_COMMUNITY): Payer: Self-pay | Admitting: *Deleted

## 2011-12-16 DIAGNOSIS — F3289 Other specified depressive episodes: Secondary | ICD-10-CM | POA: Diagnosis present

## 2011-12-16 DIAGNOSIS — I10 Essential (primary) hypertension: Secondary | ICD-10-CM | POA: Diagnosis present

## 2011-12-16 DIAGNOSIS — R82998 Other abnormal findings in urine: Secondary | ICD-10-CM | POA: Diagnosis present

## 2011-12-16 DIAGNOSIS — M1610 Unilateral primary osteoarthritis, unspecified hip: Principal | ICD-10-CM | POA: Diagnosis present

## 2011-12-16 DIAGNOSIS — F411 Generalized anxiety disorder: Secondary | ICD-10-CM | POA: Diagnosis present

## 2011-12-16 DIAGNOSIS — D62 Acute posthemorrhagic anemia: Secondary | ICD-10-CM | POA: Diagnosis not present

## 2011-12-16 DIAGNOSIS — I9589 Other hypotension: Secondary | ICD-10-CM | POA: Diagnosis not present

## 2011-12-16 DIAGNOSIS — M169 Osteoarthritis of hip, unspecified: Principal | ICD-10-CM | POA: Diagnosis present

## 2011-12-16 DIAGNOSIS — F329 Major depressive disorder, single episode, unspecified: Secondary | ICD-10-CM | POA: Diagnosis present

## 2011-12-16 DIAGNOSIS — A498 Other bacterial infections of unspecified site: Secondary | ICD-10-CM | POA: Diagnosis present

## 2011-12-16 HISTORY — PX: TOTAL HIP ARTHROPLASTY: SHX124

## 2011-12-16 SURGERY — ARTHROPLASTY, HIP, TOTAL,POSTERIOR APPROACH
Anesthesia: General | Site: Hip | Laterality: Left | Wound class: Clean

## 2011-12-16 MED ORDER — BUPIVACAINE-EPINEPHRINE 0.5% -1:200000 IJ SOLN
INTRAMUSCULAR | Status: DC | PRN
  Administered 2011-12-16: 12 mL

## 2011-12-16 MED ORDER — ACETAMINOPHEN 325 MG PO TABS
650.0000 mg | ORAL_TABLET | Freq: Four times a day (QID) | ORAL | Status: DC | PRN
  Administered 2011-12-18 – 2011-12-19 (×4): 650 mg via ORAL
  Filled 2011-12-16 (×4): qty 2

## 2011-12-16 MED ORDER — IRBESARTAN 300 MG PO TABS
300.0000 mg | ORAL_TABLET | Freq: Every day | ORAL | Status: DC
  Administered 2011-12-17 – 2011-12-20 (×3): 300 mg via ORAL
  Filled 2011-12-16 (×5): qty 1

## 2011-12-16 MED ORDER — DOCUSATE SODIUM 100 MG PO CAPS
100.0000 mg | ORAL_CAPSULE | Freq: Two times a day (BID) | ORAL | Status: DC
  Administered 2011-12-16 – 2011-12-20 (×8): 100 mg via ORAL

## 2011-12-16 MED ORDER — PROPOFOL 10 MG/ML IV EMUL
INTRAVENOUS | Status: DC | PRN
  Administered 2011-12-16: 150 mg via INTRAVENOUS

## 2011-12-16 MED ORDER — METOCLOPRAMIDE HCL 5 MG/ML IJ SOLN: 5.0000 mg | Freq: Three times a day (TID) | INTRAMUSCULAR | Status: DC | PRN

## 2011-12-16 MED ORDER — METHOCARBAMOL 500 MG PO TABS: 500.0000 mg | ORAL_TABLET | Freq: Three times a day (TID) | ORAL | Status: DC | PRN

## 2011-12-16 MED ORDER — THROMBIN 20000 UNITS EX KIT
PACK | CUTANEOUS | Status: DC | PRN
  Administered 2011-12-16: 20000 [IU] via TOPICAL

## 2011-12-16 MED ORDER — STERILE WATER FOR IRRIGATION IR SOLN
Status: DC | PRN
  Administered 2011-12-16: 3000 mL

## 2011-12-16 MED ORDER — RIVAROXABAN 10 MG PO TABS
10.0000 mg | ORAL_TABLET | Freq: Every day | ORAL | Status: DC
  Administered 2011-12-17 – 2011-12-20 (×4): 10 mg via ORAL
  Filled 2011-12-16 (×5): qty 1

## 2011-12-16 MED ORDER — NEOSTIGMINE METHYLSULFATE 1 MG/ML IJ SOLN
INTRAMUSCULAR | Status: DC | PRN
  Administered 2011-12-16: 4 mg via INTRAVENOUS

## 2011-12-16 MED ORDER — VITAMIN C 500 MG PO TABS
500.0000 mg | ORAL_TABLET | Freq: Every day | ORAL | Status: DC
  Administered 2011-12-17 – 2011-12-20 (×3): 500 mg via ORAL
  Filled 2011-12-16 (×5): qty 1

## 2011-12-16 MED ORDER — CHLORHEXIDINE GLUCONATE 4 % EX LIQD
60.0000 mL | Freq: Once | CUTANEOUS | Status: DC
  Filled 2011-12-16: qty 60

## 2011-12-16 MED ORDER — LACTATED RINGERS IV SOLN
INTRAVENOUS | Status: DC | PRN
  Administered 2011-12-16: 13:00:00 via INTRAVENOUS

## 2011-12-16 MED ORDER — HYDROMORPHONE HCL PF 1 MG/ML IJ SOLN
0.2500 mg | INTRAMUSCULAR | Status: DC | PRN
  Administered 2011-12-16: 0.25 mg via INTRAVENOUS
  Administered 2011-12-16: 0.5 mg via INTRAVENOUS

## 2011-12-16 MED ORDER — EPHEDRINE SULFATE 50 MG/ML IJ SOLN
INTRAMUSCULAR | Status: DC | PRN
  Administered 2011-12-16: 10 mg via INTRAVENOUS

## 2011-12-16 MED ORDER — KCL IN DEXTROSE-NACL 20-5-0.45 MEQ/L-%-% IV SOLN
INTRAVENOUS | Status: DC
  Administered 2011-12-16: 20:00:00 via INTRAVENOUS
  Filled 2011-12-16 (×4): qty 1000

## 2011-12-16 MED ORDER — SODIUM CHLORIDE 0.9 % IR SOLN
Status: DC | PRN
  Administered 2011-12-16: 15:00:00

## 2011-12-16 MED ORDER — ONDANSETRON HCL 4 MG PO TABS: 4.0000 mg | ORAL_TABLET | Freq: Four times a day (QID) | ORAL | Status: DC | PRN

## 2011-12-16 MED ORDER — BISACODYL 5 MG PO TBEC
5.0000 mg | DELAYED_RELEASE_TABLET | Freq: Every day | ORAL | Status: DC | PRN
  Administered 2011-12-20 (×2): 5 mg via ORAL
  Filled 2011-12-16 (×2): qty 1

## 2011-12-16 MED ORDER — CISATRACURIUM BESYLATE (PF) 10 MG/5ML IV SOLN
INTRAVENOUS | Status: DC | PRN
  Administered 2011-12-16: 7 mg via INTRAVENOUS
  Administered 2011-12-16: 3 mg via INTRAVENOUS

## 2011-12-16 MED ORDER — LACTATED RINGERS IV SOLN
INTRAVENOUS | Status: DC
  Administered 2011-12-16: 1000 mL via INTRAVENOUS

## 2011-12-16 MED ORDER — ACETAMINOPHEN 10 MG/ML IV SOLN
INTRAVENOUS | Status: DC | PRN
  Administered 2011-12-16: 1000 mg via INTRAVENOUS

## 2011-12-16 MED ORDER — GLYCOPYRROLATE 0.2 MG/ML IJ SOLN
INTRAMUSCULAR | Status: DC | PRN
  Administered 2011-12-16: 0.6 mg via INTRAVENOUS
  Administered 2011-12-16: 0.3 mg via INTRAVENOUS

## 2011-12-16 MED ORDER — ACETAMINOPHEN 650 MG RE SUPP: 650.0000 mg | Freq: Four times a day (QID) | RECTAL | Status: DC | PRN

## 2011-12-16 MED ORDER — RIVAROXABAN 10 MG PO TABS: 10.0000 mg | ORAL_TABLET | Freq: Every day | ORAL | Status: DC

## 2011-12-16 MED ORDER — PHENOL 1.4 % MT LIQD
1.0000 | OROMUCOSAL | Status: DC | PRN
  Filled 2011-12-16: qty 177

## 2011-12-16 MED ORDER — LORAZEPAM 1 MG PO TABS
1.0000 mg | ORAL_TABLET | Freq: Every day | ORAL | Status: DC
  Administered 2011-12-16 – 2011-12-19 (×4): 1 mg via ORAL
  Filled 2011-12-16 (×4): qty 1

## 2011-12-16 MED ORDER — ONDANSETRON HCL 4 MG/2ML IJ SOLN
INTRAMUSCULAR | Status: DC | PRN
  Administered 2011-12-16: 2 mg via INTRAVENOUS

## 2011-12-16 MED ORDER — HETASTARCH-ELECTROLYTES 6 % IV SOLN
INTRAVENOUS | Status: DC | PRN
  Administered 2011-12-16: 15:00:00 via INTRAVENOUS

## 2011-12-16 MED ORDER — HYDROMORPHONE HCL PF 1 MG/ML IJ SOLN
1.0000 mg | INTRAMUSCULAR | Status: DC | PRN
  Administered 2011-12-16 – 2011-12-17 (×4): 1 mg via INTRAVENOUS
  Filled 2011-12-16 (×4): qty 1

## 2011-12-16 MED ORDER — OXYCODONE HCL 5 MG PO TABS
5.0000 mg | ORAL_TABLET | ORAL | Status: DC | PRN
Start: 2011-12-16 — End: 2011-12-20
  Administered 2011-12-16 – 2011-12-17 (×4): 10 mg via ORAL
  Administered 2011-12-17: 5 mg via ORAL
  Administered 2011-12-17 – 2011-12-20 (×13): 10 mg via ORAL
  Filled 2011-12-16 (×19): qty 2

## 2011-12-16 MED ORDER — POTASSIUM CHLORIDE CRYS ER 20 MEQ PO TBCR
20.0000 meq | EXTENDED_RELEASE_TABLET | Freq: Two times a day (BID) | ORAL | Status: DC
  Administered 2011-12-16 – 2011-12-20 (×8): 20 meq via ORAL
  Filled 2011-12-16 (×9): qty 1

## 2011-12-16 MED ORDER — HYDROCHLOROTHIAZIDE 50 MG PO TABS
50.0000 mg | ORAL_TABLET | Freq: Every day | ORAL | Status: DC
  Administered 2011-12-17 – 2011-12-20 (×3): 50 mg via ORAL
  Filled 2011-12-16 (×5): qty 1

## 2011-12-16 MED ORDER — FLEET ENEMA 7-19 GM/118ML RE ENEM: 1.0000 | ENEMA | Freq: Once | RECTAL | Status: AC | PRN

## 2011-12-16 MED ORDER — VALSARTAN-HYDROCHLOROTHIAZIDE 160-25 MG PO TABS: 2.0000 | ORAL_TABLET | Freq: Every day | ORAL | Status: DC

## 2011-12-16 MED ORDER — DEXTROSE 5 % IV SOLN
1.0000 g | Freq: Once | INTRAVENOUS | Status: AC
  Administered 2011-12-16: 1 g via INTRAVENOUS
  Filled 2011-12-16: qty 10

## 2011-12-16 MED ORDER — LACTATED RINGERS IV SOLN
INTRAVENOUS | Status: DC | PRN
  Administered 2011-12-16: 15:00:00 via INTRAVENOUS

## 2011-12-16 MED ORDER — CEPHALEXIN 500 MG PO CAPS
500.0000 mg | ORAL_CAPSULE | Freq: Four times a day (QID) | ORAL | Status: DC
  Administered 2011-12-17 (×2): 500 mg via ORAL
  Filled 2011-12-16 (×4): qty 1

## 2011-12-16 MED ORDER — PROMETHAZINE HCL 25 MG/ML IJ SOLN: 6.2500 mg | INTRAMUSCULAR | Status: DC | PRN

## 2011-12-16 MED ORDER — HYDROMORPHONE HCL PF 1 MG/ML IJ SOLN
INTRAMUSCULAR | Status: DC | PRN
  Administered 2011-12-16 (×2): 1 mg via INTRAVENOUS

## 2011-12-16 MED ORDER — FENTANYL CITRATE 0.05 MG/ML IJ SOLN
INTRAMUSCULAR | Status: DC | PRN
  Administered 2011-12-16: 50 ug via INTRAVENOUS
  Administered 2011-12-16: 100 ug via INTRAVENOUS
  Administered 2011-12-16: 50 ug via INTRAVENOUS

## 2011-12-16 MED ORDER — LORATADINE 10 MG PO TABS
10.0000 mg | ORAL_TABLET | Freq: Every day | ORAL | Status: DC
  Administered 2011-12-17 – 2011-12-20 (×4): 10 mg via ORAL
  Filled 2011-12-16 (×4): qty 1

## 2011-12-16 MED ORDER — ONDANSETRON HCL 4 MG/2ML IJ SOLN: 4.0000 mg | Freq: Four times a day (QID) | INTRAMUSCULAR | Status: DC | PRN

## 2011-12-16 MED ORDER — MENTHOL 3 MG MT LOZG
1.0000 | LOZENGE | OROMUCOSAL | Status: DC | PRN
  Filled 2011-12-16: qty 9

## 2011-12-16 MED ORDER — METOPROLOL SUCCINATE ER 50 MG PO TB24
75.0000 mg | ORAL_TABLET | Freq: Every day | ORAL | Status: DC
  Administered 2011-12-17: 75 mg via ORAL
  Filled 2011-12-16 (×3): qty 1

## 2011-12-16 MED ORDER — METOCLOPRAMIDE HCL 10 MG PO TABS: 5.0000 mg | ORAL_TABLET | Freq: Three times a day (TID) | ORAL | Status: DC | PRN

## 2011-12-16 MED ORDER — INFLUENZA VIRUS VACC SPLIT PF IM SUSP
0.5000 mL | INTRAMUSCULAR | Status: AC
  Administered 2011-12-17: 0.5 mL via INTRAMUSCULAR
  Filled 2011-12-16: qty 0.5

## 2011-12-16 MED ORDER — CEFAZOLIN SODIUM-DEXTROSE 2-3 GM-% IV SOLR
2.0000 g | Freq: Four times a day (QID) | INTRAVENOUS | Status: AC
  Administered 2011-12-16 – 2011-12-18 (×8): 2 g via INTRAVENOUS
  Filled 2011-12-16 (×14): qty 50

## 2011-12-16 MED ORDER — OXYCODONE-ACETAMINOPHEN 5-325 MG PO TABS: 1.0000 | ORAL_TABLET | ORAL | Status: DC | PRN

## 2011-12-16 MED ORDER — SENNOSIDES-DOCUSATE SODIUM 8.6-50 MG PO TABS
1.0000 | ORAL_TABLET | Freq: Every evening | ORAL | Status: DC | PRN
  Filled 2011-12-16: qty 1

## 2011-12-16 MED ORDER — EPINEPHRINE HCL 1 MG/ML IJ SOLN
INTRAMUSCULAR | Status: DC | PRN
  Administered 2011-12-16: 1 mg

## 2011-12-16 MED ORDER — PAROXETINE HCL 20 MG PO TABS
40.0000 mg | ORAL_TABLET | Freq: Every day | ORAL | Status: DC
  Administered 2011-12-17 – 2011-12-20 (×4): 40 mg via ORAL
  Filled 2011-12-16 (×4): qty 2

## 2011-12-16 SURGICAL SUPPLY — 55 items
BAG SPEC THK2 15X12 ZIP CLS (MISCELLANEOUS) ×1
BAG ZIPLOCK 12X15 (MISCELLANEOUS) ×2 IMPLANT
BLADE SAW SAG 73X25 THK (BLADE) ×1
BLADE SAW SGTL 18X1.27X75 (BLADE) ×2 IMPLANT
BLADE SAW SGTL 73X25 THK (BLADE) ×1 IMPLANT
CHLORAPREP W/TINT 26ML (MISCELLANEOUS) IMPLANT
CLOTH BEACON ORANGE TIMEOUT ST (SAFETY) ×2 IMPLANT
DRAPE INCISE IOBAN 66X45 STRL (DRAPES) ×2 IMPLANT
DRAPE INCISE IOBAN 85X60 (DRAPES) ×2 IMPLANT
DRAPE ORTHO SPLIT 77X108 STRL (DRAPES) ×4
DRAPE POUCH INSTRU U-SHP 10X18 (DRAPES) ×2 IMPLANT
DRAPE SURG 17X11 SM STRL (DRAPES) ×2 IMPLANT
DRAPE SURG ORHT 6 SPLT 77X108 (DRAPES) ×2 IMPLANT
DRAPE U-SHAPE 47X51 STRL (DRAPES) ×2 IMPLANT
DRSG EMULSION OIL 3X16 NADH (GAUZE/BANDAGES/DRESSINGS) ×2 IMPLANT
DRSG MEPILEX BORDER 4X12 (GAUZE/BANDAGES/DRESSINGS) ×1 IMPLANT
DRSG MEPILEX BORDER 4X8 (GAUZE/BANDAGES/DRESSINGS) ×1 IMPLANT
DRSG PAD ABDOMINAL 8X10 ST (GAUZE/BANDAGES/DRESSINGS) ×4 IMPLANT
DURAPREP 26ML APPLICATOR (WOUND CARE) ×2 IMPLANT
ELECT BLADE TIP CTD 4 INCH (ELECTRODE) ×2 IMPLANT
ELECT REM PT RETURN 9FT ADLT (ELECTROSURGICAL) ×2
ELECTRODE REM PT RTRN 9FT ADLT (ELECTROSURGICAL) ×1 IMPLANT
EVACUATOR 1/8 PVC DRAIN (DRAIN) ×2 IMPLANT
FACESHIELD LNG OPTICON STERILE (SAFETY) ×10 IMPLANT
GLOVE BIOGEL PI IND STRL 8 (GLOVE) ×1 IMPLANT
GLOVE BIOGEL PI INDICATOR 8 (GLOVE) ×1
GLOVE SURG SS PI 8.0 STRL IVOR (GLOVE) ×4 IMPLANT
GOWN PREVENTION PLUS LG XLONG (DISPOSABLE) ×2 IMPLANT
GOWN PREVENTION PLUS XLARGE (GOWN DISPOSABLE) ×2 IMPLANT
GOWN STRL REIN XL XLG (GOWN DISPOSABLE) ×2 IMPLANT
KIT BASIN OR (CUSTOM PROCEDURE TRAY) ×2 IMPLANT
MANIFOLD NEPTUNE II (INSTRUMENTS) ×2 IMPLANT
NS IRRIG 1000ML POUR BTL (IV SOLUTION) ×2 IMPLANT
PACK TOTAL JOINT (CUSTOM PROCEDURE TRAY) ×2 IMPLANT
PASSER SUT SWANSON 36MM LOOP (INSTRUMENTS) ×2 IMPLANT
PILLOW ABDUCTION HIP (SOFTGOODS) ×1 IMPLANT
POSITIONER SURGICAL ARM (MISCELLANEOUS) ×2 IMPLANT
SPONGE GAUZE 4X4 12PLY (GAUZE/BANDAGES/DRESSINGS) ×2 IMPLANT
SPONGE LAP 18X18 X RAY DECT (DISPOSABLE) ×2 IMPLANT
SPONGE LAP 4X18 X RAY DECT (DISPOSABLE) ×2 IMPLANT
SPONGE SURGIFOAM ABS GEL 100 (HEMOSTASIS) ×1 IMPLANT
STAPLER VISISTAT (STAPLE) ×2 IMPLANT
SUCTION FRAZIER TIP 10 FR DISP (SUCTIONS) ×1 IMPLANT
SUT ETHIBOND NAB CT1 #1 30IN (SUTURE) ×7 IMPLANT
SUT VIC AB 0 CT1 27 (SUTURE) ×6
SUT VIC AB 0 CT1 27XBRD ANTBC (SUTURE) ×3 IMPLANT
SUT VIC AB 1 CT1 27 (SUTURE) ×8
SUT VIC AB 1 CT1 27XBRD ANTBC (SUTURE) ×4 IMPLANT
SUT VIC AB 2-0 CT1 27 (SUTURE) ×8
SUT VIC AB 2-0 CT1 27XBRD (SUTURE) IMPLANT
SUT VIC AB 2-0 CT1 TAPERPNT 27 (SUTURE) ×3 IMPLANT
SUT VLOC 180 0 24IN GS25 (SUTURE) ×2 IMPLANT
SYR 30ML LL (SYRINGE) ×2 IMPLANT
TRAY FOLEY CATH 14FRSI W/METER (CATHETERS) ×1 IMPLANT
WATER STERILE IRR 1500ML POUR (IV SOLUTION) ×2 IMPLANT

## 2011-12-16 NOTE — Plan of Care (Signed)
Problem: Consults Goal: Diagnosis- Total Joint Replacement Outcome: Completed/Met Date Met:  12/16/11 Primary Total Hip

## 2011-12-16 NOTE — Anesthesia Postprocedure Evaluation (Signed)
  Anesthesia Post-op Note  Patient: [name]  Procedure(s) Performed: Procedure(s) (LRB): TOTAL HIP ARTHROPLASTY (Left)  Patient Location: PACU  Anesthesia Type: General  Level of Consciousness: awake and alert   Airway and Oxygen Therapy: Patient Spontanous Breathing  Post-op Pain: mild  Post-op Assessment: Post-op Vital signs reviewed, Patient's Cardiovascular Status Stable, Respiratory Function Stable, Patent Airway and No signs of Nausea or vomiting  Post-op Vital Signs: stable  Complications: No apparent anesthesia complications

## 2011-12-16 NOTE — Brief Op Note (Signed)
12/16/2011  4:09 PM  PATIENT:  [name]  71 y.o. female  PRE-OPERATIVE DIAGNOSIS:  djd left hip  POST-OPERATIVE DIAGNOSIS:  djd left hip  PROCEDURE:  Procedure(s) (LRB) with comments: TOTAL HIP ARTHROPLASTY (Left)  SURGEON:  Surgeon(s) and Role:    * Javier Docker, MD - Primary    * Jacki Cones, MD - Assisting  PHYSICIAN ASSISTANT:   ASSISTANTS: gioffre   ANESTHESIA:   general  EBL:  Total I/O In: 1500 [I.V.:1000; IV Piggyback:500] Out: 700 [Blood:700] EBL 1000 BLOOD ADMINISTERED:none  DRAINS: none   LOCAL MEDICATIONS USED:  MARCAINE     SPECIMEN:  No Specimen  DISPOSITION OF SPECIMEN:  N/A  COUNTS:  YES  TOURNIQUET:  * No tourniquets in log *  DICTATION: .Other Dictation: Dictation Number [id number]  PLAN OF CARE: Admit to inpatient   PATIENT DISPOSITION:  PACU - hemodynamically stable.   Delay start of Pharmacological VTE agent (>24hrs) due to surgical blood loss or risk of bleeding: no

## 2011-12-16 NOTE — Interval H&P Note (Signed)
History and Physical Interval Note:  12/16/2011 12:47 PM  [name]  has presented today for surgery, with the diagnosis of djd left hip  The various methods of treatment have been discussed with the patient and family. After consideration of risks, benefits and other options for treatment, the patient has consented to  Procedure(s) (LRB) with comments: TOTAL HIP ARTHROPLASTY (Left) as a surgical intervention .  The patient's history has been reviewed, patient examined, no change in status, stable for surgery.  I have reviewed the patient's chart and labs.  Questions were answered to the patient's satisfaction.     Nishtha Raider C  No dysuria or frequency

## 2011-12-16 NOTE — Progress Notes (Signed)
Dr Shelle Iron reviewed H and H results and xray results, no new orders.

## 2011-12-16 NOTE — Anesthesia Preprocedure Evaluation (Addendum)
Anesthesia Evaluation  Patient identified by MRN, date of birth, ID band Patient awake    Reviewed: Allergy & Precautions, H&P , NPO status , Patient's Chart, lab work & pertinent test results  Airway Mallampati: II TM Distance: >3 FB Neck ROM: Full    Dental No notable dental hx.    Pulmonary pneumonia -,  CXR: No active cardiopulmonary disease. breath sounds clear to auscultation  Pulmonary exam normal       Cardiovascular Exercise Tolerance: Good hypertension, Pt. on home beta blockers and Pt. on medications negative cardio ROS  Rhythm:Regular Rate:Normal  ECG: RBBB, anterior infarct, T wave abnormalities, but unchanged from previous ECG per Dr. Anne Fu.  Cardiac evaluation in 2002 OK (negative workup for syncope).  Followed by Dr. Mayford Knife and saw her about 6 months ago. No symptoms. Before hip started hurting, she was quite active.   Neuro/Psych PSYCHIATRIC DISORDERS Anxiety Depression  Neuromuscular disease    GI/Hepatic negative GI ROS, Neg liver ROS,   Endo/Other  negative endocrine ROS  Renal/GU Renal diseaseChronic kidney disease.  negative genitourinary   Musculoskeletal negative musculoskeletal ROS (+)   Abdominal   Peds negative pediatric ROS (+)  Hematology negative hematology ROS (+)   Anesthesia Other Findings   Reproductive/Obstetrics negative OB ROS                        Anesthesia Physical Anesthesia Plan  ASA: II  Anesthesia Plan: General   Post-op Pain Management:    Induction: Intravenous  Airway Management Planned: Oral ETT  Additional Equipment:   Intra-op Plan:   Post-operative Plan: Extubation in OR  Informed Consent: I have reviewed the patients History and Physical, chart, labs and discussed the procedure including the risks, benefits and alternatives for the proposed anesthesia with the patient or authorized representative who has indicated his/her  understanding and acceptance.   Dental advisory given  Plan Discussed with: CRNA  Anesthesia Plan Comments:         Anesthesia Quick Evaluation

## 2011-12-16 NOTE — H&P (View-Only) (Signed)
CC: left hip pain HPI:  71  Y/o  female   With worsening left hip pain secondary to osteoarthritis. Patient has elected to have a total hip arthroplasty to decrease pain and increase function. PMH: hypertension, anxiety, depression Family History:negative Social:non smoker, no ETOH, no illicit drugs Meds: diovan HCT, paxil, toprol XL, potassium, motrin, norco, lorazepam, calcium, vitamins, fish oil Allergies: cipro, cleocin, citric acid ROS: Pain with ambulation Vitals: 118/68 70 12 PE: Alert and appropriate 71 y/o female   In no acute distress Cranial 2-12 grossly intact Cervical spine full rom without pain Lungs: bilateral breath sounds with no wheeze rhonchi or rales Heart: regular rate and rhythm with no murmur Abd: nontender nondistended with active bowel sounds Ext: moderate pain with rom of left hip, antalgic gait, neurovascularly intact distally. No pedal edema Pain with external and internal rotation Skin: no rashes X-rays: endstage osteoarthritis to left hip A/P: endstage osteoarthritis to left hip Plan for total hip arthroplasty to decrease pain and increase function.

## 2011-12-16 NOTE — Transfer of Care (Signed)
Immediate Anesthesia Transfer of Care Note  Patient: [name]  Procedure(s) Performed: Procedure(s) (LRB) with comments: TOTAL HIP ARTHROPLASTY (Left)  Patient Location: PACU  Anesthesia Type:General  Level of Consciousness: awake and alert   Airway & Oxygen Therapy: Patient Spontanous Breathing and Patient connected to face mask oxygen  Post-op Assessment: Report given to PACU RN and Post -op Vital signs reviewed and stable  Post vital signs: Reviewed and stable  Complications: No apparent anesthesia complications

## 2011-12-16 NOTE — Progress Notes (Signed)
Urine culture E. Coli. CeftriaxoneIV then kefzol for 48 hours IV then Keflex PO one day. Repeat UA Sunday.

## 2011-12-17 DIAGNOSIS — D62 Acute posthemorrhagic anemia: Secondary | ICD-10-CM | POA: Diagnosis not present

## 2011-12-17 LAB — BASIC METABOLIC PANEL
BUN: 11 mg/dL (ref 6–23)
Creatinine, Ser: 0.57 mg/dL (ref 0.50–1.10)
GFR calc Af Amer: >90 mL/min (ref >90)
GFR calc non Af Amer: >90 mL/min (ref >90)
Glucose, Bld: 151 mg/dL — ABNORMAL HIGH (ref 70–99)

## 2011-12-17 LAB — CBC
HCT: 22 % — ABNORMAL LOW (ref 36.0–46.0)
Hemoglobin: 7.5 g/dL — ABNORMAL LOW (ref 12.0–15.0)
MCHC: 34.1 g/dL (ref 30.0–36.0)
MCV: 87 fL (ref 78.0–100.0)
RDW: 12.4 % (ref 11.5–15.5)

## 2011-12-17 LAB — PREPARE RBC (CROSSMATCH)

## 2011-12-17 MED ORDER — CEPHALEXIN 500 MG PO CAPS
500.0000 mg | ORAL_CAPSULE | Freq: Four times a day (QID) | ORAL | Status: DC
  Administered 2011-12-18 – 2011-12-20 (×9): 500 mg via ORAL
  Filled 2011-12-17 (×11): qty 1

## 2011-12-17 MED ORDER — BLISTEX EX OINT
TOPICAL_OINTMENT | CUTANEOUS | Status: AC
  Filled 2011-12-17: qty 10

## 2011-12-17 MED FILL — KCl 20 MEQ/L (0.15%) in Dextrose 5% & NaCl 0.45% Inj: INTRAVENOUS | Qty: 1000 | Status: AC

## 2011-12-17 NOTE — Evaluation (Signed)
Occupational Therapy Evaluation Patient Details Name: [name] MRN: [id number] DOB: 28-Aug-1940 Today's Date: 12/17/2011 Time: 8295-6213 OT Time Calculation (min): 30 min  OT Assessment / Plan / Recommendation Clinical Impression  This 71 y.o. femal admitted for Lt. THA.  Pt with posterior THA precautions.  Pt. will benefit from acute OT for the below listed deficits.  Recommend SNF level rehab at discharge to allow pt to return home at modified independent level    OT Assessment  Patient needs continued OT Services    Follow Up Recommendations  Skilled nursing facility    Barriers to Discharge Decreased caregiver support    Equipment Recommendations  None recommended by PT;None recommended by OT    Recommendations for Other Services    Frequency  Min 2X/week    Precautions / Restrictions Precautions Precautions: Posterior Hip Precaution Booklet Issued: Yes (comment) Precaution Comments: sign hung in room Restrictions Weight Bearing Restrictions: Yes LLE Weight Bearing: Partial weight bearing LLE Partial Weight Bearing Percentage or Pounds: 50% LLE   Pertinent Vitals/Pain    ADL  Eating/Feeding: Independent Where Assessed - Eating/Feeding: Chair Grooming: Wash/dry hands;Wash/dry face;Teeth care;Brushing hair;Set up Where Assessed - Grooming: Supported sitting Upper Body Bathing: Set up Where Assessed - Upper Body Bathing: Supported sitting Lower Body Bathing: Maximal assistance Where Assessed - Lower Body Bathing: Supported sit to stand Upper Body Dressing: Minimal assistance Where Assessed - Upper Body Dressing: Unsupported sitting Lower Body Dressing: +1 Total assistance Where Assessed - Lower Body Dressing: Supported sit to Pharmacist, hospital: +2 Total assistance Toilet Transfer: Patient Percentage: 30% Statistician Method: Sit to stand;Stand pivot Acupuncturist: Bedside commode Toileting - Clothing Manipulation and Hygiene: +1 Total  assistance Where Assessed - Glass blower/designer Manipulation and Hygiene: Standing Equipment Used: Rolling walker;Gait belt Transfers/Ambulation Related to ADLs: Total A +2 (pt 30%) for sit to stand ADL Comments: Pt. instructed in THA precautions.  Pt. with poor awareness of precautions    OT Diagnosis: Generalized weakness;Acute pain  OT Problem List: Decreased strength;Decreased activity tolerance;Decreased knowledge of use of DME or AE;Decreased knowledge of precautions;Pain OT Treatment Interventions: Self-care/ADL training;DME and/or AE instruction;Therapeutic activities;Patient/family education   OT Goals Acute Rehab OT Goals OT Goal Formulation: With patient Time For Goal Achievement: 12/24/11 Potential to Achieve Goals: Good ADL Goals Pt Will Perform Lower Body Dressing: with mod assist;with adaptive equipment;Sit to stand from bed;Sit to stand from chair ADL Goal: Lower Body Dressing - Progress: Goal set today Pt Will Transfer to Toilet: with min assist;Ambulation;3-in-1 ADL Goal: Toilet Transfer - Progress: Goal set today Pt Will Perform Toileting - Clothing Manipulation: with min assist;Standing ADL Goal: Toileting - Clothing Manipulation - Progress: Goal set today Additional ADL Goal #1: Pt. will be independent with THA precautions ADL Goal: Additional Goal #1 - Progress: Goal set today  Visit Information  Last OT Received On: 12/17/11 Assistance Needed: +2 PT/OT Co-Evaluation/Treatment: Yes    Subjective Data  Subjective: "My legs have always done that"  re: internally rotate Patient Stated Goal: Pt did not state   Prior Functioning     Home Living Lives With: Significant other Available Help at Discharge: Skilled Nursing Facility Type of Home: House Home Access: Stairs to enter [employer] of Steps: 1 Entrance Stairs-Rails: None Home Layout: One level Bathroom Shower/Tub: Health visitor: Handicapped height Home Adaptive  Equipment: Straight cane;Shower chair without back;Grab bars in shower;Grab bars around toilet Prior Function Level of Independence: Independent with assistive device(s) (used SPC) Able to  Take Stairs?: Yes Driving: Yes Communication Communication: No difficulties         Vision/Perception     Cognition  Overall Cognitive Status: Appears within functional limits for tasks assessed/performed Arousal/Alertness: Awake/alert Orientation Level: Appears intact for tasks assessed Behavior During Session: [facility] for tasks performed    Extremity/Trunk Assessment Right Upper Extremity Assessment RUE ROM/Strength/Tone: WFL for tasks assessed RUE Coordination: WFL - gross/fine motor Left Upper Extremity Assessment LUE ROM/Strength/Tone: WFL for tasks assessed LUE Coordination: WFL - gross/fine motor Right Lower Extremity Assessment RLE ROM/Strength/Tone: Within functional levels RLE Sensation: WFL - Light Touch RLE Coordination: WFL - gross/fine motor Left Lower Extremity Assessment LLE ROM/Strength/Tone: Deficits;Due to pain LLE ROM/Strength/Tone Deficits: ankle WNL, hip 2/5, knee NT LLE Sensation: WFL - Light Touch LLE Coordination: WFL - gross/fine motor Trunk Assessment Trunk Assessment: Normal     Mobility Bed Mobility Bed Mobility: Supine to Sit;Sitting - Scoot to Edge of Bed Supine to Sit: 1: +2 Total assist Supine to Sit: Patient Percentage: 30% Sitting - Scoot to Edge of Bed: 1: +2 Total assist Sitting - Scoot to Edge of Bed: Patient Percentage: 30% Details for Bed Mobility Assistance: assist to elevate trunk, support LLE Transfers Transfers: Sit to Stand;Stand to Sit Sit to Stand: 1: +2 Total assist;From bed Sit to Stand: Patient Percentage: 30% Stand to Sit: 1: +2 Total assist;To chair/3-in-1;With armrests Stand to Sit: Patient Percentage: 50% Details for Transfer Assistance: VCs hand placement, assist to achieve upright position     Shoulder Instructions       Exercise Total Joint Exercises Ankle Circles/Pumps: AROM;10 reps;Supine;Both Hip ABduction/ADduction: AAROM;Left;10 reps;Supine Straight Leg Raises: AAROM;Left;10 reps;Supine   Balance Balance Balance Assessed: Yes Static Sitting Balance Static Sitting - Balance Support: Feet supported Static Sitting - Level of Assistance: 5: Stand by assistance Static Sitting - Comment/# of Minutes: 3   End of Session OT - End of Session Equipment Utilized During Treatment: Gait belt Activity Tolerance: Patient limited by fatigue Patient left: in chair;with call bell/phone within reach Nurse Communication: Mobility status  GO     Burnis Halling M 12/17/2011, 2:41 PM

## 2011-12-17 NOTE — Op Note (Signed)
NAMERASHA, IBE NO.:  [id number]  MEDICAL RECORD NO.:  [id number]  LOCATION:  1621                         FACILITY:  [facility]  PHYSICIAN:  Jene Every, M.D.    DATE OF BIRTH:  [birth year]  DATE OF PROCEDURE:  12/16/2011 DATE OF DISCHARGE:                              OPERATIVE REPORT   PREOPERATIVE DIAGNOSIS:  Osteoarthritis of the left hip, morbid obesity.  POSTOPERATIVE DIAGNOSIS:  Osteoarthritis of the left hip, morbid obesity.  PROCEDURE PERFORMED:  Left total hip arthroplasty utilizing DePuy AML 12.5 stem, 52 mm cup, +8 neck, +8 head, 10 degree liner.  ANESTHESIA:  General.  ASSISTANT:  Georges Lynch. Darrelyn Hillock, M.D.  HISTORY:  A 71 year old, end-stage osteoarthrosis of the left hip, lateral joint space, osteophytes, subchondral cyst, refractory conservative treatment indicated for replacement of the degenerated joint.  Risks and benefits discussed including bleeding, infection, damage to neurovascular structure, DVT, PE, anesthetic complications, leg length discrepancy, dislocation, etc.  TECHNIQUE:  With the patient in supine position, after induction of adequate general anesthesia, 2 g Kefzol, she was given 1 g of Zosyn for a positive UA.  She however had positive UA.  She was placed in __________ right lateral decubitus position.  All bony prominences well padded.  Hip holder was utilized.  Axillary roll utilized.  The left lower extremity was prepped and draped in usual sterile fashion. Standard posterolateral approach to the hip was made.  Midline incision based on trochanter subcutaneous tissue was dissected.  Electrocautery was utilized to achieve hemostasis.  She had significant subcutaneous adipose tissue.  The fascia lata was identified and divided on skin incision.  Bursa was excised.  Adductor tenotomy performed, leg was internally rotated.  External rotators and the piriformis identified, tagged and reflected posteriorly to protect the  sciatic nerve throughout the case.  Remainder of the external rotators were identified and divided and retracted posteriorly.  T-shaped capsulotomy performed.  She had hypertrophic synovitis, __________ fair amount of mild-to-moderate bleeding __________ pressure was elevated.  Pressure lowering medications were utilized.  Next, the hip was dislocated.  Severe osteoarthrosis of the head was noted __________ femoral neck cut 1 cm above the lesser trochanter.  We then entered the canal with a T-handle reamer and a box chisel and irrigated the canal just prior to the reaming.  We reamed and lateralized it.  We had a good cortical contact at a __________.  We lateralized it throughout the case.  We used a trial of appropriate anteversion up to a 12 __________ calcar reamer in appropriate anteversion __________ used a large broach.  Attention was turned towards the acetabulum retractors and utilized.  Electrocautery utilized to achieve hemostasis throughout the case.  We removed the labrum from the acetabulum.  Severe osteoarthrosis was noted and inspected the fovea.  We then started with the reamer 34 medialized in it and obtained a depth and then sequentially reamed to a good cortical fit to a 48 mm diameter.  Good bleeding tissue was noted with a good posterior wall.  We reamed to the appropriate version of abduction and anteversion.  __________ external alignment guide.  Then used a trial acetabular component in appropriate version, this was  seated at a 51. We then used a porous-coated Duraloc permanent acetabular component 52 was impacted and placed with excellent purchase.  Seated against the acetabular wall.  After the impaction in appropriate anteversion and abduction, this seated well into the acetabulum, there was no movement. Placed through screw holes in the superior portion of the acetabulum and the posterior superior portion after appropriate drilling.  Depth gauge measurement  inserted at 30 mm screw and a 20 mm screw __________ superolateral palpated posteriorly and insuring no broaching of the wall to the affected sciatic nerve.  Excellent purchase was obtained.  We placed a center hole cover, a 10 degree posterior liner impacted into place.  We then turned our attention back towards the femur, irrigated the canal and then chose a 12 AML porous-coated prosthesis in the appropriate anteversion, impacted and placed with excellent purchase. We then reduced it and was found to be reduced it, and we sequentially added length to a +8.  __________ with that we had appropriate leg length and full stability in flexion, internal rotation, and also anterior stability and had good extension.  After this wound was copiously irrigated, electrocautery was utilized to __________ hemostasis.  We had some oozing from the inferior aspect of __________ cancellous bone was on the acetabulum with thrombin-soaked Gelfoam there.  I then palpated the sciatic nerve, protected throughout the case __________ sponge was intact.  I then repaired the adductor tenotomy of 1 Ethibond interrupted figure-of-eight sutures.  The tensor fascia lata with #1 Ethibond interrupted figure-of-eight sutures over sutured with V- lock subcu with multiple 2-0 Vicryl simple sutures.  Skin was reapproximated with small stapler.  Wound was dressed sterilely.  She was placed supine on hospital bed.  Leg lengths were equivalent, good rotation, neurovascularly intact.  Knee immobilizer placed, extubated without difficulty and transported to the recovery room in satisfactory condition.  The patient tolerated the procedure well.  No complications.  EBL was 1000 mL.     Jene Every, M.D.     Cordelia Pen  D:  12/16/2011  T:  12/17/2011  Job:  [id number]

## 2011-12-17 NOTE — Evaluation (Signed)
Physical Therapy Evaluation Patient Details Name: [name] MRN: [id number] DOB: November 20, 1940 Today's Date: 12/17/2011 Time: 1320-1405 PT Time Calculation (min): 45 min  PT Assessment / Plan / Recommendation Clinical Impression  71 y.o. female POD #1 for L THA, posterior approach. Pt had blood loss anemia and is receiving 2 units of blood today. Bed to recliner transfer with RW with +2 total assist. ST-SNF recommended. Pt would benefit from acute PT to maximize safety and independence with mobility.     PT Assessment  Patient needs continued PT services    Follow Up Recommendations  Post acute inpatient;Supervision/Assistance - 24 hour    Does the patient have the potential to tolerate intense rehabilitation   No, Recommend SNF  Barriers to Discharge        Equipment Recommendations  None recommended by PT    Recommendations for Other Services OT consult   Frequency 7X/week    Precautions / Restrictions Precautions Precautions: Posterior Hip Precaution Booklet Issued: Yes (comment) Precaution Comments: sign hung in room Restrictions Weight Bearing Restrictions: Yes LLE Weight Bearing: Partial weight bearing LLE Partial Weight Bearing Percentage or Pounds: 50% LLE   Pertinent Vitals/Pain **5/10 L hip Premedicated, ice applied to L hip*      Mobility  Bed Mobility Bed Mobility: Supine to Sit Supine to Sit: 1: +2 Total assist Supine to Sit: Patient Percentage: 30% Details for Bed Mobility Assistance: assist to elevate trunk, support LLE Transfers Transfers: Sit to Stand;Stand to Sit;Stand Pivot Transfers Sit to Stand: 1: +2 Total assist;From bed Sit to Stand: Patient Percentage: 30% Stand to Sit: 1: +2 Total assist;To chair/3-in-1;With armrests Stand to Sit: Patient Percentage: 50% Details for Transfer Assistance: VCs hand placement, assist to achieve upright position Ambulation/Gait Ambulation/Gait Assistance: 1: +2 Total assist Ambulation/Gait: Patient  Percentage: 60% Ambulation Distance (Feet): 2 Feet Assistive device: Rolling walker Gait Pattern: Decreased step length - right;Decreased step length - left General Gait Details: VCs for sequencing/motor planning, assist to shift weight, pt fatigued quickly    Shoulder Instructions     Exercises Total Joint Exercises Ankle Circles/Pumps: AROM;10 reps;Supine;Both Hip ABduction/ADduction: AAROM;Left;10 reps;Supine Straight Leg Raises: AAROM;Left;10 reps;Supine   PT Diagnosis: Difficulty walking;Acute pain;Generalized weakness  PT Problem List: Decreased strength;Decreased activity tolerance;Pain;Decreased mobility;Decreased knowledge of use of DME PT Treatment Interventions: Gait training;DME instruction;Therapeutic exercise;Therapeutic activities;Functional mobility training;Patient/family education   PT Goals Acute Rehab PT Goals PT Goal Formulation: With patient Time For Goal Achievement: 12/24/11 Potential to Achieve Goals: Good Pt will go Supine/Side to Sit: with mod assist PT Goal: Supine/Side to Sit - Progress: Goal set today Pt will go Sit to Stand: with mod assist PT Goal: Sit to Stand - Progress: Goal set today Pt will Ambulate: 16 - 50 feet;with min assist;with rolling walker PT Goal: Ambulate - Progress: Goal set today Pt will Perform Home Exercise Program: with min assist PT Goal: Perform Home Exercise Program - Progress: Goal set today  Visit Information  Last PT Received On: 12/17/11    Subjective Data  Subjective: I'm a little dizzy.  Patient Stated Goal: do water aerobics at [facility], travel   Prior Functioning  Home Living Lives With: Significant other Type of Home: Skilled Nursing Facility Home Access: Stairs to enter Secretary/administrator of Steps: 1 Entrance Stairs-Rails: None Home Layout: One level Bathroom Shower/Tub: Health visitor: Handicapped height Home Adaptive Equipment: Straight cane;Shower chair without back;Grab bars in  shower;Grab bars around toilet Prior Function Level of Independence: Independent with assistive device(s) (used  SPC)    Cognition  Overall Cognitive Status: Appears within functional limits for tasks assessed/performed Arousal/Alertness: Awake/alert Orientation Level: Appears intact for tasks assessed Behavior During Session: [facility] for tasks performed    Extremity/Trunk Assessment Right Upper Extremity Assessment RUE ROM/Strength/Tone: [facility] for tasks assessed Left Upper Extremity Assessment LUE ROM/Strength/Tone: WFL for tasks assessed Right Lower Extremity Assessment RLE ROM/Strength/Tone: Within functional levels RLE Sensation: WFL - Light Touch RLE Coordination: WFL - gross/fine motor Left Lower Extremity Assessment LLE ROM/Strength/Tone: Deficits;Due to pain LLE ROM/Strength/Tone Deficits: ankle WNL, hip 2/5, knee NT LLE Sensation: WFL - Light Touch LLE Coordination: WFL - gross/fine motor Trunk Assessment Trunk Assessment: Normal   Balance Balance Balance Assessed: Yes Static Sitting Balance Static Sitting - Balance Support: Feet supported Static Sitting - Level of Assistance: 5: Stand by assistance Static Sitting - Comment/# of Minutes: 3  End of Session PT - End of Session Equipment Utilized During Treatment: Gait belt;Left knee immobilizer Activity Tolerance: Patient limited by fatigue Patient left: in chair;with call bell/phone within reach  GP     Tamala Ser 12/17/2011, 2:21 PM  [phone]

## 2011-12-17 NOTE — Progress Notes (Signed)
Clinical Social Work Department CLINICAL SOCIAL WORK PLACEMENT NOTE 12/17/2011  Patient:  [name], [name]  Account Number:  [id number] Admit date:  12/16/2011  Clinical Social Worker:  Leron Croak, CLINICAL SOCIAL WORKER  Date/time:  12/17/2011 03:24 PM  Clinical Social Work is seeking post-discharge placement for this patient at the following level of care:   SKILLED NURSING   (*CSW will update this form in Epic as items are completed)   12/17/2011  Patient/family provided with [facility] Department of Clinical Social Work's list of facilities offering this level of care within the geographic area requested by the patient (or if unable, by the patient's family).  12/17/2011  Patient/family informed of their freedom to choose among providers that offer the needed level of care, that participate in Medicare, Medicaid or managed care program needed by the patient, have an available bed and are willing to accept the patient.  12/17/2011  Patient/family informed of [facility]' ownership interest in [facility], as well as of the fact that they are under no obligation to receive care at this facility.  PASARR submitted to EDS on  PASARR number received from EDS on   FL2 transmitted to all facilities in geographic area requested by pt/family on  12/17/2011 FL2 transmitted to all facilities within larger geographic area on 12/17/2011  Patient informed that his/her managed care company has contracts with or will negotiate with  certain facilities, including the following:     Patient/family informed of bed offers received:   Patient chooses bed at  Physician recommends and patient chooses bed at    Patient to be transferred to  on   Patient to be transferred to facility by   The following physician request were entered in Epic:   Additional Comments:  Leron Croak, Leeroy Bock Long Weekend Coverage [phone]

## 2011-12-17 NOTE — Progress Notes (Signed)
   Subjective: 1 Day Post-Op Procedure(s) (LRB): TOTAL HIP ARTHROPLASTY (Left) Patient reports pain as moderate.   Patient seen in rounds with Dr. Darrelyn Hillock. Patient is well, and has had no acute complaints or problems. She reports that she does have some pain in the left hip. She denies shortness of breath and chest pain. No issues overnight.  Objective: Vital signs in last 24 hours: Temp:  [97.5 F (36.4 C)-98.7 F (37.1 C)] 98.4 F (36.9 C) (11/02 0609) Pulse Rate:  [62-86] 79  (11/02 0609) Resp:  [6-18] 16  (11/02 0609) BP: (90-159)/(61-82) 90/61 mmHg (11/02 0609) SpO2:  [96 %-100 %] 100 % (11/02 0609) Weight:  [92.5 kg (203 lb 14.8 oz)] 92.5 kg (203 lb 14.8 oz) (11/01 1745)  Intake/Output from previous day:  Intake/Output Summary (Last 24 hours) at 12/17/11 0850 Last data filed at 12/17/11 0629  Gross per 24 hour  Intake 3706.25 ml  Output   1975 ml  Net 1731.25 ml     Labs:  Basename 12/17/11 0429 12/16/11 1625  HGB 7.5* 9.4*    Basename 12/17/11 0429 12/16/11 1625  WBC 6.9 --  RBC 2.53* --  HCT 22.0* 26.9*  PLT 219 --    Basename 12/17/11 0429  NA 130*  K 3.8  CL 98  CO2 28  BUN 11  CREATININE 0.57  GLUCOSE 151*  CALCIUM 7.5*    EXAM General - Patient is Alert and Oriented Extremity - Neurologically intact Neurovascular intact Dorsiflexion/Plantar flexion intact No cellulitis present Dressing - dressing C/D/I Motor Function - intact, moving foot and toes well on exam.   Past Medical History  Diagnosis Date  . Hypertension   . Seasonal allergies   . Arthritis   . Anxiety   . Depression   . Pneumonia     hx of   . Neuromuscular disorder     numbness in middle toes on left leg   . Chronic kidney disease     hx of uti   . Anemia     hx of years ago     Assessment/Plan: 1 Day Post-Op Procedure(s) (LRB): TOTAL HIP ARTHROPLASTY (Left) Principal Problem:  *DJD (degenerative joint disease) of hip Postoperative acute blood loss  anemia  Estimated Body mass index is 32.91 kg/(m^2) as calculated from the following:   Height as of this encounter: 5\' 6" (1.676 m).   Weight as of this encounter: 203 lb 14.8 oz(92.5 kg).  Advance diet Up with therapy Continue foley due to blood transfusion; will continue until blood transfusion complete  DVT Prophylaxis - Xarelto Weight Bearing As Tolerated  Hip Precautions  Will transfuse 2 units today. Hgb recheck after. Will start therapy after transfusion today.     Janelle Culton LAUREN 12/17/2011, 8:50 AM

## 2011-12-17 NOTE — Progress Notes (Signed)
Clinical Social Work Department BRIEF PSYCHOSOCIAL ASSESSMENT 12/17/2011  Patient:  [name], [name]     Account Number:  [id number]     Admit date:  12/16/2011  Clinical Social Worker:  Leron Croak, CLINICAL SOCIAL WORKER  Date/Time:  12/17/2011 02:51 PM  Referred by:  Physician  Date Referred:  12/17/2011 Referred for  SNF Placement   Other Referral:   Interview type:  Patient Other interview type:    PSYCHOSOCIAL DATA Living Status:  WITH ADULT CHILDREN Admitted from facility:   Level of care:   Primary support name:  Katalaya Seider Primary support relationship to patient:  CHILD, ADULT Degree of support available:   Pt has a good degree of support from her community and three sons.    CURRENT CONCERNS Current Concerns  Post-Acute Placement   Other Concerns:    SOCIAL WORK ASSESSMENT / PLAN CSW met with the Pt at the bedside for d/c planning to SNF for rehab post surgery. Pt was aware of the need for SNF placement and has visited some of the facilities. Pt's first choice is [facility] and does not have second or third choice at this time. Pt does not want to be placed at [facility] or [facility]. CSW has Pt's permission for SNF search in [facility].   Assessment/plan status:  Information/Referral to [facility] Other assessment/ plan:   Additional contact information:  [place] Dorwart  [phone]    [phone] h  Kaprisha Reidel [phone] c    [phone] h  Antonieta Paramo  [phone] c    [phone] h   Information/referral to community resources:   CSW provided a SNF listing for [employer] [facility].    PATIENT'S/FAMILY'S RESPONSE TO PLAN OF CARE: Pt was appreciative for assistance with SNF search and d/c planning.    Leron Croak, LCSWA [employer] Coverage [phone]

## 2011-12-17 NOTE — Care Management Note (Addendum)
    Page 1 of 2   12/19/2011     11:28:38 AM   CARE MANAGEMENT NOTE 12/19/2011  Patient:  [name], [name]   Account Number:  [id number]  Date Initiated:  12/17/2011  Documentation initiated by:  Tera Mater  Subjective/Objective Assessment:   71yo female admitted for same day surgery of total Left hip.     Action/Plan:   In to speak with pt. about discharge plans, and pt. stated she wants to go to SNF for rehab.   Anticipated DC Date:  12/20/2011   Anticipated DC Plan:  SKILLED NURSING FACILITY  In-house referral  Clinical Social Worker      DC Planning Services  CM consult      [facility] Choice  NA   Choice offered to / List presented to:  NA   DME arranged  NA      DME agency  NA     HH arranged  NA      HH agency  NA   Status of service:  Completed, signed off Medicare Important Message given?   (If response is "NO", the following Medicare IM given date fields will be blank) Date Medicare IM given:   Date Additional Medicare IM given:    Discharge Disposition:    Per UR Regulation:  Reviewed for med. necessity/level of care/duration of stay  If discussed at Long Length of Stay Meetings, dates discussed:    Comments:  12/17/11 1030 In to speak with pt. about [facility] services and pt. stated she wanted to go to SNF for rehab.  CSW consult ordered for new SNF placement. NCM to follow for any further discharge needs.  Tera Mater, RN, BSN NCM

## 2011-12-18 LAB — URINE MICROSCOPIC-ADD ON

## 2011-12-18 LAB — TYPE AND SCREEN: Unit division: 0

## 2011-12-18 LAB — CBC
HCT: 24.7 % — ABNORMAL LOW (ref 36.0–46.0)
Hemoglobin: 8.7 g/dL — ABNORMAL LOW (ref 12.0–15.0)
MCH: 30.6 pg (ref 26.0–34.0)
MCV: 87 fL (ref 78.0–100.0)
RBC: 2.84 MIL/uL — ABNORMAL LOW (ref 3.87–5.11)

## 2011-12-18 LAB — URINALYSIS, ROUTINE W REFLEX MICROSCOPIC
Bilirubin Urine: NEGATIVE
Glucose, UA: NEGATIVE mg/dL
Ketones, ur: NEGATIVE mg/dL
Specific Gravity, Urine: 1.006 (ref 1.005–1.030)
pH: 6.5 (ref 5.0–8.0)

## 2011-12-18 MED ORDER — METOPROLOL SUCCINATE ER 50 MG PO TB24
75.0000 mg | ORAL_TABLET | Freq: Every day | ORAL | Status: DC
  Administered 2011-12-19: 75 mg via ORAL
  Filled 2011-12-18 (×2): qty 1

## 2011-12-18 MED ORDER — METOPROLOL SUCCINATE ER 50 MG PO TB24
75.0000 mg | ORAL_TABLET | Freq: Every day | ORAL | Status: DC
  Filled 2011-12-18: qty 1

## 2011-12-18 MED ORDER — CALCIUM CARBONATE 1250 (500 CA) MG PO TABS
1.0000 | ORAL_TABLET | Freq: Two times a day (BID) | ORAL | Status: DC
  Administered 2011-12-18 – 2011-12-20 (×5): 500 mg via ORAL
  Filled 2011-12-18 (×6): qty 1

## 2011-12-18 NOTE — Progress Notes (Signed)
Physical Therapy Treatment Patient Details Name: [name] MRN: [id number] DOB: 1940-10-06 Today's Date: 12/18/2011 Time: 1127-1200 PT Time Calculation (min): 33 min  PT Assessment / Plan / Recommendation Comments on Treatment Session  Pt. is requiring extensive assitance for mobility, positioning of LLE. when in recliner, pillow placed under and between to prop near neutral. Plans SNF.    Follow Up Recommendations  Post acute inpatient     Does the patient have the potential to tolerate intense rehabilitation  No, Recommend SNF  Barriers to Discharge        Equipment Recommendations  None recommended by PT    Recommendations for Other Services    Frequency 7X/week   Plan Discharge plan remains appropriate;Frequency remains appropriate    Precautions / Restrictions Precautions Precautions: Posterior Hip Restrictions LLE Weight Bearing: Partial weight bearing   Pertinent Vitals/Pain L hip 6/10 had medication . Positioned. ice    Mobility  Bed Mobility Supine to Sit: 1: +2 Total assist Supine to Sit: Patient Percentage: 30% Sitting - Scoot to Edge of Bed: 1: +2 Total assist Sitting - Scoot to Edge of Bed: Patient Percentage: 30% Details for Bed Mobility Assistance: Pt. requires constant positioning of L leg as it tends to roll inward. Transfers Transfers: Stand Pivot Transfers Sit to Stand: 1: +2 Total assist;From bed;With armrests;From chair/3-in-1 Sit to Stand: Patient Percentage: 30% Stand to Sit: 1: +2 Total assist;With armrests;With upper extremity assist Stand to Sit: Patient Percentage: 40% Stand Pivot Transfers: 1: +2 Total assist Stand Pivot Transfers: Patient Percentage: 50% Details for Transfer Assistance: multimodal cues for UE's. LLE precautions, pt had difficulty advancing LLE, again it tends to roll inward in standing..  Ambulation/Gait Ambulation/Gait Assistance: Not tested (comment) (recliner brought up to Pt. after BSC used.)    Exercises      PT Diagnosis:    PT Problem List:   PT Treatment Interventions:     PT Goals Acute Rehab PT Goals Pt will go Supine/Side to Sit: with mod assist PT Goal: Supine/Side to Sit - Progress: Progressing toward goal Pt will go Sit to Stand: with mod assist PT Goal: Sit to Stand - Progress: Progressing toward goal Pt will Ambulate: 16 - 50 feet;with min assist;with rolling walker PT Goal: Ambulate - Progress: Not progressing  Visit Information  Last PT Received On: 12/18/11 Assistance Needed: +2    Subjective Data  Subjective: I am just weak.    Cognition  Overall Cognitive Status: Appears within functional limits for tasks assessed/performed Arousal/Alertness: Awake/alert    Balance     End of Session PT - End of Session Activity Tolerance: Patient limited by fatigue;Patient limited by pain Patient left: in chair;with call bell/phone within reach Nurse Communication: Mobility status   GP     Rada Hay 12/18/2011, 4:31 PM

## 2011-12-18 NOTE — Progress Notes (Signed)
Subjective: Patient denies any complaints no shortness of breath no chest pains no nausea or vomiting. Patient does have a little bit of decreased appetite but she is eaten OK without any problems. Patient pain medicines are working fairly well.   Objective: Vital signs in last 24 hours: Temp:  [98.2 F (36.8 C)-100.7 F (38.2 C)] 98.2 F (36.8 C) (11/03 0634) Pulse Rate:  [67-84] 67  (11/03 0634) Resp:  [16-20] 18  (11/03 0634) BP: (91-115)/(43-72) 98/43 mmHg (11/03 0634) SpO2:  [95 %-98 %] 98 % (11/03 0634)  Intake/Output from previous day: 11/02 0701 - 11/03 0700 In: 3297.6 [P.O.:720; I.V.:1756.3; Blood:671.3; IV Piggyback:150] Out: 1825 [Urine:1825] Intake/Output this shift:     Basename 12/18/11 0120 12/17/11 0429 12/16/11 1625  HGB 8.7* 7.5* 9.4*    Basename 12/18/11 0120 12/17/11 0429  WBC 8.2 6.9  RBC 2.84* 2.53*  HCT 24.7* 22.0*  PLT 177 219    Basename 12/17/11 0429  NA 130*  K 3.8  CL 98  CO2 28  BUN 11  CREATININE 0.57  GLUCOSE 151*  CALCIUM 7.5*   No results found for this basename: LABPT:2,INR:2 in the last 72 hours  Patient's conscious alert heavyset female appears to be in no distress laying in bed easily wakes. The left hip wound is well approximated she has a little bit of seeping serosanguineous drainage no signs of infection she has some bruising around the hip region her thigh and leg is soft nontender her leg is neuromotor vascularly intact  Assessment/Plan: Postop day #2 left total hip arthroplasty doing well Slight hypotensive in the setting of a hypertensive patient on metoprolol will hold metoprolol today Mild hypocalcemia will give calcium supplementation asymptomatic Acute postoperative blood loss anemia improved  Plan out of bed with physical therapy today plan for skilled nursing data form tomorrow. Due to her slightly low calcium level will give calcium by mouth supplementation. Will DC IV hydration. We'll hold metoprolol today due  to blood pressure and her heart rate is in acceptable range to prevent syncopal episodes with physical therapy. We'll monitor her hemoglobin level.   Jamelle Rushing 12/18/2011, 7:03 AM

## 2011-12-19 ENCOUNTER — Encounter (HOSPITAL_COMMUNITY): Payer: Self-pay | Admitting: Specialist

## 2011-12-19 LAB — CBC
Hemoglobin: 9.5 g/dL — ABNORMAL LOW (ref 12.0–15.0)
Platelets: 196 10*3/uL (ref 150–400)
RBC: 3.07 MIL/uL — ABNORMAL LOW (ref 3.87–5.11)

## 2011-12-19 LAB — URINALYSIS, ROUTINE W REFLEX MICROSCOPIC
Bilirubin Urine: NEGATIVE
Glucose, UA: NEGATIVE mg/dL
Hgb urine dipstick: NEGATIVE
Ketones, ur: NEGATIVE mg/dL
Leukocytes, UA: NEGATIVE
pH: 6.5 (ref 5.0–8.0)

## 2011-12-19 LAB — URINE MICROSCOPIC-ADD ON

## 2011-12-19 NOTE — Progress Notes (Signed)
Physical Therapy Treatment Patient Details Name: [name] MRN: [id number] DOB: [birth year] Today's Date: 12/19/2011 Time: 5784-6962 PT Time Calculation (min): 28 min  PT Assessment / Plan / Recommendation Comments on Treatment Session  Pt progressing slowly and requires extra VC's and time. Assisted pt out of recliner to [facility] then amb very limited distance. Assisted back to bed.  Pt plans to D/c to SNF for Rehab.    Follow Up Recommendations   (skilled nursing)     Does the patient have the potential to tolerate intense rehabilitation     Barriers to Discharge        Equipment Recommendations  None recommended by PT    Recommendations for Other Services    Frequency 7X/week   Plan Discharge plan remains appropriate    Precautions / Restrictions Precautions Precautions: Posterior Hip Precaution Comments: Pt could not recall any of her THP so re educated and demonstarted Restrictions Weight Bearing Restrictions: Yes LLE Weight Bearing: Partial weight bearing LLE Partial Weight Bearing Percentage or Pounds: Pt unaware of her PWB so re educated and demonstarted Other Position/Activity Restrictions: Required positioning with pilloe/towel roll between knees    Pertinent Vitals/Pain C/o 5/10 pain    Mobility  Bed Mobility Bed Mobility: sit to supine Supine to Sit: 1: +1 Total assist Supine to Sit: Patient Percentage: 60% Sitting - Scoot to Edge of Bed: 1: +1 Total assist Sitting - Scoot to Edge of Bed: Patient Percentage: 50% Details for Bed Mobility Assistance: Pt requires increased time and 75% VC's on proper tech.  Pt still groggy and foggy. Transfers Transfers: Sit to Stand;Stand to Sit Sit to Stand: 3: Mod assist;From chair;From elevated surface;From toilet Stand to Sit: 3: Mod assist;To toilet;To bed Details for Transfer Assistance: 75% VC's on proper tech, hand placement and increased time to process then carry thru with instructions.  Pt admits she feels  sleepy/groggy. Ambulation/Gait Ambulation/Gait Assistance: 3: Mod assist Ambulation Distance (Feet): 1 Feet  Assistive device: Rolling walker Ambulation/Gait Assistance Details: 75% VC's on proper tech, sequencing and PWB percaution.  Pt requires inceased time and max encouragement to incease self assistance. Gait Pattern: Step-to pattern;Trunk flexed;Decreased stance time - left;Decreased step length - right;Decreased step length - left Gait velocity: very slow    PT Goals                                                             progressing    Visit Information  Last PT Received On: 12/19/11 Assistance Needed: +1    Subjective Data  Subjective: I'm so sleepy Patient Stated Goal: n/a   Cognition       Balance     End of Session PT - End of Session Equipment Utilized During Treatment: Gait belt Activity Tolerance: Patient limited by fatigue;Patient limited by pain Patient left: in bed;with call bell/phone within reach Nurse Communication: Mobility status   Felecia Shelling  PTA [facility]  Acute  Rehab Pager     [phone]

## 2011-12-19 NOTE — Progress Notes (Signed)
Subjective: 3 Days Post-Op Procedure(s) (LRB): TOTAL HIP ARTHROPLASTY (Left) Patient reports pain as 4 on 0-10 scale.    Objective: Vital signs in last 24 hours: Temp:  [98.6 F (37 C)-98.9 F (37.2 C)] 98.6 F (37 C) (11/04 0630) Pulse Rate:  [83-87] 87  (11/04 0900) Resp:  [16-19] 19  (11/04 1139) BP: (89-117)/(53-69) 114/69 mmHg (11/04 0900) SpO2:  [88 %-99 %] 94 % (11/04 0630)  Intake/Output from previous day: 11/03 0701 - 11/04 0700 In: 960 [P.O.:960] Out: 1950 [Urine:1950] Intake/Output this shift: Total I/O In: 240 [P.O.:240] Out: 400 [Urine:400]   Basename 12/19/11 0425 12/18/11 0120 12/17/11 0429 12/16/11 1625  HGB 9.5* 8.7* 7.5* 9.4*    Basename 12/19/11 0425 12/18/11 0120  WBC 9.1 8.2  RBC 3.07* 2.84*  HCT 27.1* 24.7*  PLT 196 177    Basename 12/17/11 0429  NA 130*  K 3.8  CL 98  CO2 28  BUN 11  CREATININE 0.57  GLUCOSE 151*  CALCIUM 7.5*   No results found for this basename: LABPT:2,INR:2 in the last 72 hours  Neurologically intact Neurovascular intact Intact pulses distally Dorsiflexion/Plantar flexion intact Incision: dressing C/D/I Compartment soft  Assessment/Plan: 3 Days Post-Op Procedure(s) (LRB): TOTAL HIP ARTHROPLASTY (Left) Plan for discharge tomorrow Check H/H for acute blood loss anemia. UA better. Continue keflex for Asx UA.  Genise Strack C 12/19/2011, 12:47 PM

## 2011-12-19 NOTE — Progress Notes (Signed)
Physical Therapy Treatment Patient Details Name: [name] MRN: [id number] DOB: January 09, 1941 Today's Date: 12/19/2011 Time: 7829-5621 PT Time Calculation (min): 32 min  PT Assessment / Plan / Recommendation Comments on Treatment Session  Pt progressing slowly and requires extra VC's and time. Assisted pt OOB to [facility] then amb very limited distance.  Pt plans to D/c to SNF for Rehab.    Follow Up Recommendations   (skilled nursing)     Does the patient have the potential to tolerate intense rehabilitation     Barriers to Discharge        Equipment Recommendations  None recommended by PT    Recommendations for Other Services    Frequency 7X/week   Plan Discharge plan remains appropriate    Precautions / Restrictions Precautions Precautions: Posterior Hip Precaution Comments: Pt could not recall any of her THP so re educated and demonstarted Restrictions Weight Bearing Restrictions: Yes LLE Weight Bearing: Partial weight bearing LLE Partial Weight Bearing Percentage or Pounds: Pt unaware of her PWB so re educated and demonstarted Other Position/Activity Restrictions: Required positioning with pilloe/towel roll between knees    Pertinent Vitals/Pain C/o 8/10 with act ICE applied    Mobility   Bed Mobility Bed Mobility: Supine to Sit Supine to Sit: 1: +1 Total assist Supine to Sit: Patient Percentage: 60% Sitting - Scoot to Edge of Bed: 1: +1 Total assist Sitting - Scoot to Edge of Bed: Patient Percentage: 50% Details for Bed Mobility Assistance: Pt requires increased time and 75% VC's on proper tech.  Pt still groggy and foggy . Transfers Transfers: Sit to Stand;Stand to Sit Sit to Stand: 3: Mod assist;From bed;From elevated surface;From toilet Stand to Sit: 3: Mod assist;To toilet;To chair/3-in-1 Details for Transfer Assistance: 75% VC's on proper tech, hand placement and increased time to process then carry thru with instructions.  Pt admits she feels  sleepy/groggy.  Ambulation/Gait Ambulation/Gait Assistance: 3: Mod assist Ambulation Distance (Feet): 6 Feet (from bed to door frame) Assistive device: Rolling walker Ambulation/Gait Assistance Details: 75% VC's on proper tech, sequencing and PWB percaution.  Pt requires inceased time and max encouragement to incease self assistance. Gait Pattern: Step-to pattern;Trunk flexed;Decreased stance time - left;Decreased step length - right;Decreased step length - left Gait velocity: very slow    Exercises Total Joint Exercises Ankle Circles/Pumps: AROM;Both;10 reps;Supine Quad Sets: AROM;Both;10 reps;Supine Gluteal Sets: AROM;Both;10 reps;Supine Heel Slides: AAROM;Left;5 reps;Supine Hip ABduction/ADduction: AAROM;Left;5 reps;Supine    PT Goals                                    progressing    Visit Information  Last PT Received On: 12/19/11 Assistance Needed: +1    Subjective Data  Subjective: I'm so sleepy Patient Stated Goal: n/a   Cognition    impaired   Balance   poor  End of Session PT - End of Session Equipment Utilized During Treatment: Gait belt Activity Tolerance: Patient limited by fatigue;Patient limited by pain Patient left: in chair;with call bell/phone within reach Nurse Communication: Mobility status   Felecia Shelling  PTA [facility]  Acute  Rehab Pager     [phone]

## 2011-12-19 NOTE — Progress Notes (Signed)
CSW assisting with d/c planning. [facility] SNF bed is available today if pt is stable for d/c. CSW will follow to assist with d/c planning to SNF.  Cori Razor  LCSW [phone]

## 2011-12-20 LAB — URINE CULTURE: Colony Count: NO GROWTH

## 2011-12-20 LAB — HEMOGLOBIN AND HEMATOCRIT, BLOOD: Hemoglobin: 9.1 g/dL — ABNORMAL LOW (ref 12.0–15.0)

## 2011-12-20 NOTE — Progress Notes (Signed)
Subjective: 4 Days Post-Op Procedure(s) (LRB): TOTAL HIP ARTHROPLASTY (Left) Patient reports pain as 2 on 0-10 scale.  No dysuria or freq  Objective: Vital signs in last 24 hours: Temp:  [98.6 F (37 C)-99.1 F (37.3 C)] 98.6 F (37 C) (11/05 0536) Pulse Rate:  [81-87] 84  (11/05 0536) Resp:  [16-19] 16  (11/05 0536) BP: (96-124)/(62-70) 124/69 mmHg (11/05 0536) SpO2:  [92 %-97 %] 97 % (11/05 0536)  Intake/Output from previous day: 11/04 0701 - 11/05 0700 In: 1180 [P.O.:1080] Out: 2150 [Urine:2150] Intake/Output this shift:     Basename 12/19/11 0425 12/18/11 0120  HGB 9.5* 8.7*    Basename 12/19/11 0425 12/18/11 0120  WBC 9.1 8.2  RBC 3.07* 2.84*  HCT 27.1* 24.7*  PLT 196 177   No results found for this basename: NA:2,K:2,CL:2,CO2:2,BUN:2,CREATININE:2,GLUCOSE:2,CALCIUM:2 in the last 72 hours No results found for this basename: LABPT:2,INR:2 in the last 72 hours  Neurologically intact Neurovascular intact Sensation intact distally Intact pulses distally Dorsiflexion/Plantar flexion intact Incision: scant drainage no DVT.  Assessment/Plan: 4 Days Post-Op Procedure(s) (LRB): TOTAL HIP ARTHROPLASTY (Left) Discharge to SNF Dressing changes  Brailen Macneal C 12/20/2011, 7:07 AM

## 2011-12-20 NOTE — Progress Notes (Signed)
Physical Therapy Treatment Patient Details Name: [name] MRN: [id number] DOB: 1941/02/04 Today's Date: 12/20/2011 Time: 1191-4782 PT Time Calculation (min): 24 min  PT Assessment / Plan / Recommendation Comments on Treatment Session  POD #4 pm session.  Assisted pt from recliner to [facility] then back to bed.  Pt c/o max fatigue with c/o poor sleep last night due to freq urination.  Ptplans to D/C to SNF.    Follow Up Recommendations   (skilled nursing)     Does the patient have the potential to tolerate intense rehabilitation     Barriers to Discharge        Equipment Recommendations       Recommendations for Other Services    Frequency 7X/week   Plan Discharge plan remains appropriate    Precautions / Restrictions Precautions Precautions: Fall;Posterior Hip Precaution Comments: Pt could not recall any of her THP so re educated and demonstarted  Restrictions Weight Bearing Restrictions: Yes LLE Weight Bearing: Partial weight bearing LLE Partial Weight Bearing Percentage or Pounds: 50% WB    Pertinent Vitals/Pain C/o 8/10 L hip pain with activity    Mobility  Bed Mobility Bed Mobility: Sit to Supine Supine to Sit: 2: Max assist Sitting - Scoot to Edge of Bed: 1: +1 Total assist Sit to Supine: 2: Max assist Details for Bed Mobility Assistance: Assisted pt back to bed with max assist to support B LE and avoid L hip internal rotation  Transfers Transfers: Sit to Stand;Stand to Sit Sit to Stand: 3: Mod assist;From chair/3-in-1;From toilet Stand to Sit: 3: Mod assist;To toilet;To bed Details for Transfer Assistance: 50% VC's on proper tech, hand placement plus increased time to weight shift and complete turns due to 8/10 L hip pain  Ambulation/Gait Ambulation/Gait Assistance: 3: Mod assist Ambulation Distance (Feet): 2 Feet Assistive device: Rolling walker Ambulation/Gait Assistance Details: increased time to weight shift and advance R LE due to pain L hip.  Only  able to amb a few feet in the room after using BSC. Gait Pattern: Step-to pattern;Decreased stance time - left;Trunk flexed Gait velocity: decreased     PT Goals                    progressing    Visit Information  Last PT Received On: 12/20/11 Assistance Needed: +1    Subjective Data  Subjective: I need to go back to bed   Cognition       Balance     End of Session PT - End of Session Equipment Utilized During Treatment: Gait belt Activity Tolerance: Patient limited by fatigue;Patient limited by pain Patient left: in bed;with call bell/phone within reach (ICE to L hip) Nurse Communication: Mobility status   Felecia Shelling  PTA [facility]  Acute  Rehab Pager     [phone]

## 2011-12-20 NOTE — Discharge Summary (Signed)
Physician Discharge Summary   Patient ID: [name] MRN: [id number] DOB/AGE: 1940-03-21 71 y.o.  Admit date: 12/16/2011 Discharge date: 12/20/2011  Primary Diagnosis: DJD left hip  Admission Diagnoses: Same Past Medical History  Diagnosis Date  . Hypertension   . Seasonal allergies   . Arthritis   . Anxiety   . Depression   . Pneumonia     hx of   . Neuromuscular disorder     numbness in middle toes on left leg   . Chronic kidney disease     hx of uti   . Anemia     hx of years ago    Discharge Diagnoses:   Principal Problem:  *DJD (degenerative joint disease) of hip Active Problems:  Postoperative anemia due to acute blood loss  Estimated Body mass index is 32.91 kg/(m^2) as calculated from the following:   Height as of this encounter: 5\' 6" (1.676 m).   Weight as of this encounter: 203 lb 14.8 oz(92.5 kg).  Classification of overweight in adults according to BMI (WHO, 1998)   Procedure: Procedure(s) (LRB): TOTAL HIP ARTHROPLASTY (Left)   Consults: None  HPI: End stage djd left hip refractory. Laboratory Data: Admission on 12/16/2011  Component Date Value Range Status  . ABO/RH(D) 12/16/2011 O NEG   Final  . Antibody Screen 12/16/2011 NEG   Final  . Sample Expiration 12/16/2011 12/19/2011   Final  . Unit Number 12/16/2011 [id number]   Final  . Blood Component Type 12/16/2011 RED CELLS,LR   Final  . Unit division 12/16/2011 00   Final  . Status of Unit 12/16/2011 ISSUED,FINAL   Final  . Transfusion Status 12/16/2011 OK TO TRANSFUSE   Final  . Crossmatch Result 12/16/2011 Compatible   Final  . Unit Number 12/16/2011 [id number]   Final  . Blood Component Type 12/16/2011 RBC LR PHER1   Final  . Unit division 12/16/2011 00   Final  . Status of Unit 12/16/2011 ISSUED,FINAL   Final  . Transfusion Status 12/16/2011 OK TO TRANSFUSE   Final  . Crossmatch Result 12/16/2011 Compatible   Final  . Specimen Description 12/16/2011 BLADDER   Final  .  Special Requests 12/16/2011 NONE   Final  . Culture  Setup Time 12/16/2011 12/17/2011 02:23   Final  . Colony Count 12/16/2011 >=100,000 COLONIES/ML   Final  . Culture 12/16/2011 ESCHERICHIA COLI   Final  . Report Status 12/16/2011 12/20/2011 FINAL   Final  . Organism ID, Bacteria 12/16/2011 ESCHERICHIA COLI   Final  . Hemoglobin 12/16/2011 9.4* 12.0 - 15.0 g/dL Final  . HCT 13/24/4010 26.9* 36.0 - 46.0 % Final  . WBC 12/17/2011 6.9  4.0 - 10.5 K/uL Final  . RBC 12/17/2011 2.53* 3.87 - 5.11 MIL/uL Final  . Hemoglobin 12/17/2011 7.5* 12.0 - 15.0 g/dL Final   Comment: DELTA CHECK NOTED                          REPEATED TO VERIFY  . HCT 12/17/2011 22.0* 36.0 - 46.0 % Final  . MCV 12/17/2011 87.0  78.0 - 100.0 fL Final  . MCH 12/17/2011 29.6  26.0 - 34.0 pg Final  . MCHC 12/17/2011 34.1  30.0 - 36.0 g/dL Final  . RDW 27/25/3664 12.4  11.5 - 15.5 % Final  . Platelets 12/17/2011 219  150 - 400 K/uL Final  . Sodium 12/17/2011 130* 135 - 145 mEq/L Final  . Potassium 12/17/2011 3.8  3.5 - 5.1  mEq/L Final  . Chloride 12/17/2011 98  96 - 112 mEq/L Final  . CO2 12/17/2011 28  19 - 32 mEq/L Final  . Glucose, Bld 12/17/2011 151* 70 - 99 mg/dL Final  . BUN 16/11/9602 11  6 - 23 mg/dL Final  . Creatinine, Ser 12/17/2011 0.57  0.50 - 1.10 mg/dL Final  . Calcium 54/10/8117 7.5* 8.4 - 10.5 mg/dL Final  . GFR calc non Af Amer 12/17/2011 >90  >90 mL/min Final  . GFR calc Af Amer 12/17/2011 >90  >90 mL/min Final   Comment:                                 The eGFR has been calculated                          using the CKD EPI equation.                          This calculation has not been                          validated in all clinical                          situations.                          eGFR's persistently                          <90 mL/min signify                          possible Chronic Kidney Disease.  . Order Confirmation 12/17/2011 ORDER PROCESSED BY BLOOD BANK   Final  . WBC  12/18/2011 8.2  4.0 - 10.5 K/uL Final  . RBC 12/18/2011 2.84* 3.87 - 5.11 MIL/uL Final  . Hemoglobin 12/18/2011 8.7* 12.0 - 15.0 g/dL Final  . HCT 14/78/2956 24.7* 36.0 - 46.0 % Final  . MCV 12/18/2011 87.0  78.0 - 100.0 fL Final  . MCH 12/18/2011 30.6  26.0 - 34.0 pg Final  . MCHC 12/18/2011 35.2  30.0 - 36.0 g/dL Final  . RDW 21/30/8657 12.7  11.5 - 15.5 % Final  . Platelets 12/18/2011 177  150 - 400 K/uL Final  . Color, Urine 12/18/2011 YELLOW  YELLOW Final  . APPearance 12/18/2011 CLOUDY* CLEAR Final  . Specific Gravity, Urine 12/18/2011 1.006  1.005 - 1.030 Final  . pH 12/18/2011 6.5  5.0 - 8.0 Final  . Glucose, UA 12/18/2011 NEGATIVE  NEGATIVE mg/dL Final  . Hgb urine dipstick 12/18/2011 TRACE* NEGATIVE Final  . Bilirubin Urine 12/18/2011 NEGATIVE  NEGATIVE Final  . Ketones, ur 12/18/2011 NEGATIVE  NEGATIVE mg/dL Final  . Protein, ur 84/69/6295 NEGATIVE  NEGATIVE mg/dL Final  . Urobilinogen, UA 12/18/2011 0.2  0.0 - 1.0 mg/dL Final  . Nitrite 28/41/3244 NEGATIVE  NEGATIVE Final  . Leukocytes, UA 12/18/2011 MODERATE* NEGATIVE Final  . Squamous Epithelial / LPF 12/18/2011 FEW* RARE Final  . WBC, UA 12/18/2011 11-20  <3 WBC/hpf Final  . RBC / HPF 12/18/2011 0-2  <3 RBC/hpf Final  . Bacteria, UA 12/18/2011 RARE  RARE Final  . WBC 12/19/2011  9.1  4.0 - 10.5 K/uL Final  . RBC 12/19/2011 3.07* 3.87 - 5.11 MIL/uL Final  . Hemoglobin 12/19/2011 9.5* 12.0 - 15.0 g/dL Final  . HCT 47/82/9562 27.1* 36.0 - 46.0 % Final  . MCV 12/19/2011 88.3  78.0 - 100.0 fL Final  . MCH 12/19/2011 30.9  26.0 - 34.0 pg Final  . MCHC 12/19/2011 35.1  30.0 - 36.0 g/dL Final  . RDW 13/09/6576 13.1  11.5 - 15.5 % Final  . Platelets 12/19/2011 196  150 - 400 K/uL Final  . Color, Urine 12/19/2011 YELLOW  YELLOW Final  . APPearance 12/19/2011 CLEAR  CLEAR Final  . Specific Gravity, Urine 12/19/2011 1.016  1.005 - 1.030 Final  . pH 12/19/2011 6.5  5.0 - 8.0 Final  . Glucose, UA 12/19/2011 NEGATIVE  NEGATIVE  mg/dL Final  . Hgb urine dipstick 12/19/2011 NEGATIVE  NEGATIVE Final  . Bilirubin Urine 12/19/2011 NEGATIVE  NEGATIVE Final  . Ketones, ur 12/19/2011 NEGATIVE  NEGATIVE mg/dL Final  . Protein, ur 46/96/2952 30* NEGATIVE mg/dL Final  . Urobilinogen, UA 12/19/2011 0.2  0.0 - 1.0 mg/dL Final  . Nitrite 84/13/2440 NEGATIVE  NEGATIVE Final  . Leukocytes, UA 12/19/2011 NEGATIVE  NEGATIVE Final  . Squamous Epithelial / LPF 12/19/2011 FEW* RARE Final  . WBC, UA 12/19/2011 0-2  <3 WBC/hpf Final  . Bacteria, UA 12/19/2011 FEW* RARE Final  . Daryll Drown 12/19/2011 MUCOUS PRESENT   Final  Hospital Outpatient Visit on 12/12/2011  Component Date Value Range Status  . Specimen Description 12/12/2011 URINE, CLEAN CATCH   Final  . Special Requests 12/12/2011 NONE   Final  . Culture  Setup Time 12/12/2011 12/13/2011 02:16   Final  . Colony Count 12/12/2011 >=100,000 COLONIES/ML   Final  . Culture 12/12/2011 ESCHERICHIA COLI   Final  . Report Status 12/12/2011 12/14/2011 FINAL   Final  . Organism ID, Bacteria 12/12/2011 ESCHERICHIA COLI   Final  Hospital Outpatient Visit on 12/12/2011  Component Date Value Range Status  . WBC 12/12/2011 6.3  4.0 - 10.5 K/uL Final  . RBC 12/12/2011 4.30  3.87 - 5.11 MIL/uL Final  . Hemoglobin 12/12/2011 12.8  12.0 - 15.0 g/dL Final  . HCT 12/11/2534 36.9  36.0 - 46.0 % Final  . MCV 12/12/2011 85.8  78.0 - 100.0 fL Final  . MCH 12/12/2011 29.8  26.0 - 34.0 pg Final  . MCHC 12/12/2011 34.7  30.0 - 36.0 g/dL Final  . RDW 64/40/3474 12.3  11.5 - 15.5 % Final  . Platelets 12/12/2011 314  150 - 400 K/uL Final  . Sodium 12/12/2011 133* 135 - 145 mEq/L Final  . Potassium 12/12/2011 4.0  3.5 - 5.1 mEq/L Final  . Chloride 12/12/2011 95* 96 - 112 mEq/L Final  . CO2 12/12/2011 29  19 - 32 mEq/L Final  . Glucose, Bld 12/12/2011 92  70 - 99 mg/dL Final  . BUN 25/95/6387 13  6 - 23 mg/dL Final  . Creatinine, Ser 12/12/2011 0.62  0.50 - 1.10 mg/dL Final  . Calcium 56/43/3295 9.3   8.4 - 10.5 mg/dL Final  . Total Protein 12/12/2011 6.9  6.0 - 8.3 g/dL Final  . Albumin 18/84/1660 3.8  3.5 - 5.2 g/dL Final  . AST 63/02/6008 20  0 - 37 U/L Final  . ALT 12/12/2011 18  0 - 35 U/L Final  . Alkaline Phosphatase 12/12/2011 66  39 - 117 U/L Final  . Total Bilirubin 12/12/2011 0.2* 0.3 - 1.2 mg/dL Final  .  GFR calc non Af Amer 12/12/2011 89* >90 mL/min Final  . GFR calc Af Amer 12/12/2011 >90  >90 mL/min Final   Comment:                                 The eGFR has been calculated                          using the CKD EPI equation.                          This calculation has not been                          validated in all clinical                          situations.                          eGFR's persistently                          <90 mL/min signify                          possible Chronic Kidney Disease.  [name] Prothrombin Time 12/12/2011 12.2  11.6 - 15.2 seconds Final  . INR 12/12/2011 0.91  0.00 - 1.49 Final  . Color, Urine 12/12/2011 YELLOW  YELLOW Final  . APPearance 12/12/2011 CLOUDY* CLEAR Final  . Specific Gravity, Urine 12/12/2011 1.014  1.005 - 1.030 Final  . pH 12/12/2011 7.0  5.0 - 8.0 Final  . Glucose, UA 12/12/2011 NEGATIVE  NEGATIVE mg/dL Final  . Hgb urine dipstick 12/12/2011 NEGATIVE  NEGATIVE Final  . Bilirubin Urine 12/12/2011 NEGATIVE  NEGATIVE Final  . Ketones, ur 12/12/2011 NEGATIVE  NEGATIVE mg/dL Final  . Protein, ur 16/11/9602 NEGATIVE  NEGATIVE mg/dL Final  . Urobilinogen, UA 12/12/2011 0.2  0.0 - 1.0 mg/dL Final  . Nitrite 54/10/8117 POSITIVE* NEGATIVE Final  . Leukocytes, UA 12/12/2011 MODERATE* NEGATIVE Final  . MRSA, PCR 12/12/2011 NEGATIVE  NEGATIVE Final  . Staphylococcus aureus 12/12/2011 POSITIVE* NEGATIVE Final   Comment:                                 The Xpert SA Assay (FDA                          approved for NASAL specimens                          in patients over 87 years of age),                          is one  component of                          a comprehensive surveillance                          program.  Test performance has                          been validated by [facility] for patients greater                          than or equal to 23 year old.                          It is not intended                          to diagnose infection nor to                          guide or monitor treatment.  [name] aPTT 12/12/2011 29  24 - 37 seconds Final  . Squamous Epithelial / LPF 12/12/2011 FEW* RARE Final  . WBC, UA 12/12/2011 21-50  <3 WBC/hpf Final  . Bacteria, UA 12/12/2011 FEW* RARE Final  . Urine-Other 12/12/2011 AMORPHOUS URATES/PHOSPHATES   Final     X-Rays:Dg Chest 2 View  12/12/2011  *RADIOLOGY REPORT*  Clinical Data: Preop left hip replacement  CHEST - 2 VIEW  Comparison: 07/26/2006  Findings: Heart size and vascularity are normal.  Mild scarring in the right middle lobe or lingula on the lateral view is unchanged. Negative for heart failure.  Negative for infiltrate or mass. Lungs otherwise clear. Small hiatal hernia.  IMPRESSION: No active cardiopulmonary disease.   Original Report Authenticated By: Camelia Phenes, M.D.    Dg Hip Complete Left  12/12/2011  *RADIOLOGY REPORT*  Clinical Data: Preop hip replacement on the left  LEFT HIP - COMPLETE 2+ VIEW  Comparison: 08/03/2006  Findings: Severe degenerative change in the left hip joint with cartilage loss, subchondral sclerosis, and mild collapse of the femoral head.  This has developed since the prior study.  Question underlying AVN of the femoral head.  Prior right hip replacement unchanged from  the   prior study.  IMPRESSION: Severe degenerative change in the left hip joint with marked progression from 2008.  Negative for fracture.   Original Report Authenticated By: Camelia Phenes, M.D.    Dg Pelvis Portable  12/16/2011  *RADIOLOGY REPORT*  Clinical Data: 71 year old female status post left hip  replacement.  PORTABLE PELVIS  Comparison: 08/03/2006.  Findings: Portable AP view 1642 hours.  New left total hip arthroplasty.  Hardware components appear intact normally aligned. Postoperative changes to the surrounding soft tissues.  No unexpected osseous changes.  Stable right total hip arthroplasty.  IMPRESSION: Left total hip arthroplasty, no adverse features.   Original Report Authenticated By: Erskine Speed, M.D.    Dg Hip Portable 1 View Left  12/16/2011  *RADIOLOGY REPORT*  Clinical Data: 71 year old female status post left hip replacement.  PORTABLE LEFT HIP - 1 VIEW  Comparison: 12/12/2011.  1644 hours the same day.  Findings: AP portable view of the left hip at 1646 hours.  New left total hip arthroplasty, hardware components appear intact and normally aligned in the AP dimension.  Overlying skin staples and postoperative changes to the soft tissues.  Visible left hemi pelvis appears stable.  IMPRESSION: Left total  hip arthroplasty with no adverse features.   Original Report Authenticated By: Erskine Speed, M.D.     EKG: Orders placed during the hospital encounter of 12/16/11  . EKG     Hospital Course: Tolerated procedure well. No complications. Placed on kefzol and ceftriaxone for asymptomatic Ecoli in urine. Kept on Kefzol then keflex PO total 4 days. F/U UA yesterday clear. No symptoms. Also received 2 units PRBC's for sx acute blood loss anemia. Progressed slowly in PT  Discharge Medications: Prior to Admission medications   Medication Sig Start Date End Date Taking? Authorizing Provider  Calcium Carbonate-Vitamin D (CALCIUM-D PO) Take 600 mg by mouth. Takes 2 tablets daily   Yes Historical Provider, MD  Desonide Crea-Moisturizing Lot 0.05 % KIT Apply topically. Uses every other day as a preventive for fungus  Under breasts and abdominal area   Yes Historical Provider, MD  fish oil-omega-3 fatty acids 1000 MG capsule Take by mouth daily.   Yes Historical Provider, MD  loratadine  (CLARITIN) 10 MG tablet Take 10 mg by mouth daily.   Yes Historical Provider, MD  LORazepam (ATIVAN) 1 MG tablet Take 1 mg by mouth at bedtime.  05/23/11  Yes Historical Provider, MD  Multiple Vitamin (MULTIVITAMIN) tablet Take 1 tablet by mouth daily.   Yes Historical Provider, MD  PARoxetine (PAXIL) 40 MG tablet Take 40 mg by mouth daily before breakfast.  07/09/11  Yes Historical Provider, MD  potassium chloride SA (K-DUR,KLOR-CON) 20 MEQ tablet Take 20 mEq by mouth 2 (two) times daily.  06/07/11  Yes Historical Provider, MD  tetrahydrozoline-zinc (VISINE-AC) 0.05-0.25 % ophthalmic solution Place 1 drop into both eyes every evening.   Yes Historical Provider, MD  TOPROL XL 50 MG 24 hr tablet Take 75 mg by mouth daily before breakfast. Takes 1 and one half tablet daily 08/20/11  Yes Historical Provider, MD  valsartan-hydrochlorothiazide (DIOVAN-HCT) 160-25 MG per tablet Take 2 tablets by mouth daily before breakfast.  06/25/11  Yes Historical Provider, MD  vitamin C (ASCORBIC ACID) 500 MG tablet Take 500 mg by mouth daily.   Yes Historical Provider, MD  methocarbamol (ROBAXIN) 500 MG tablet Take 1 tablet (500 mg total) by mouth 3 (three) times daily between meals as needed. 12/16/11   Javier Docker, MD  oxyCODONE-acetaminophen (PERCOCET) 5-325 MG per tablet Take 1-2 tablets by mouth every 4 (four) hours as needed for pain. 12/16/11   Javier Docker, MD  rivaroxaban (XARELTO) 10 MG TABS tablet Take 1 tablet (10 mg total) by mouth daily. 12/16/11   Javier Docker, MD    Diet: Regular diet Activity:PWB No bending hip over 90 degrees- A "L" Angle Do not cross legs Do not let foot roll inward When turning these patients a pillow should be placed between the patient's legs to prevent crossing. Patients should have the affected knee fully extended when trying to sit or stand from all surfaces to prevent excessive hip flexion. When ambulating and turning toward the affected side the affected leg should have  the toes turned out prior to moving the walker and the rest of patient's body as to prevent internal rotation/ turning in of the leg. Abduction pillows are the most effective way to prevent a patient from not crossing legs or turning toes in at rest. If an abduction pillow is not ordered placing a regular pillow length wise between the patient's legs is also an effective reminder. It is imperative that these precautions be maintained so that the surgical  hip does not dislocate. Follow-up:in 2 weeks Disposition - Rehab Discharged Condition: good   Discharge Orders    Future Orders Please Complete By Expires   Diet - low sodium heart healthy      Partial weight bearing      Scheduling Instructions:   50% left leg   Partial weight bearing      Scheduling Instructions:   Left leg 50%   Call MD / Call 911      Comments:   If you experience chest pain or shortness of breath, CALL 911 and be transported to the hospital emergency room.  If you develope a fever above 101 F, pus (white drainage) or increased drainage or redness at the wound, or calf pain, call your surgeon's office.   Constipation Prevention      Comments:   Drink plenty of fluids.  Prune juice may be helpful.  You may use a stool softener, such as Colace (over the counter) 100 mg twice a day.  Use MiraLax (over the counter) for constipation as needed.   Increase activity slowly as tolerated      Follow the hip precautions as taught in Physical Therapy      Change dressing      Comments:   You may change your dressing on left , then change the dressing daily with sterile 4 x 4 inch gauze dressing and paper tape.  You may clean the incision with alcohol prior to redressing   TED hose      Comments:   Use stockings (TED hose) for 2  weeks on both leg(s).  You may remove them at night for sleeping.       Medication List     As of 12/20/2011  7:16 AM    STOP taking these medications         HYDROcodone-acetaminophen 5-325 MG  per tablet   Commonly known as: NORCO/VICODIN      ibuprofen 200 MG tablet   Commonly known as: ADVIL,MOTRIN      TAKE these medications         CALCIUM-D PO   Take 600 mg by mouth. Takes 2 tablets daily      Desonide Crea-Moisturizing Lot 0.05 % Kit   Apply topically. Uses every other day as a preventive for fungus  Under breasts and abdominal area      fish oil-omega-3 fatty acids 1000 MG capsule   Take by mouth daily.      loratadine 10 MG tablet   Commonly known as: CLARITIN   Take 10 mg by mouth daily.      LORazepam 1 MG tablet   Commonly known as: ATIVAN   Take 1 mg by mouth at bedtime.      methocarbamol 500 MG tablet   Commonly known as: ROBAXIN   Take 1 tablet (500 mg total) by mouth 3 (three) times daily between meals as needed.      multivitamin tablet   Take 1 tablet by mouth daily.      oxyCODONE-acetaminophen 5-325 MG per tablet   Commonly known as: PERCOCET/ROXICET   Take 1-2 tablets by mouth every 4 (four) hours as needed for pain.      PARoxetine 40 MG tablet   Commonly known as: PAXIL   Take 40 mg by mouth daily before breakfast.      potassium chloride SA 20 MEQ tablet   Commonly known as: K-DUR,KLOR-CON   Take 20 mEq by mouth 2 (two) times  daily.      rivaroxaban 10 MG Tabs tablet   Commonly known as: XARELTO   Take 1 tablet (10 mg total) by mouth daily.      tetrahydrozoline-zinc 0.05-0.25 % ophthalmic solution   Commonly known as: VISINE-AC   Place 1 drop into both eyes every evening.      TOPROL XL 50 MG 24 hr tablet   Generic drug: metoprolol succinate   Take 75 mg by mouth daily before breakfast. Takes 1 and one half tablet daily      valsartan-hydrochlorothiazide 160-25 MG per tablet   Commonly known as: DIOVAN-HCT   Take 2 tablets by mouth daily before breakfast.      vitamin C 500 MG tablet   Commonly known as: ASCORBIC ACID   Take 500 mg by mouth daily.           Follow-up Information    Follow up with Nalla Purdy  C, MD. In 2 weeks.   Contact information:   [address] [place] [address] [place] [address] [phone]          Signed: Javier Docker 12/20/2011, 7:16 AM

## 2011-12-20 NOTE — Progress Notes (Signed)
SNF bed available today at [facility]. Pt has been d/c and [facility] contacted for 4:15 pick up. Pt is aware and in agreement with d/c plan to [facility] today.  Cori Razor LCSW [phone]

## 2011-12-20 NOTE — Progress Notes (Signed)
Physical Therapy Treatment Patient Details Name: [name] MRN: [id number] DOB: 05-May-1940 Today's Date: 12/20/2011 Time: 1191-4782 PT Time Calculation (min): 26 min  PT Assessment / Plan / Recommendation Comments on Treatment Session  POD #4 L THR posterior Approach. Pt progressing slowly and plans to D/C to SNF at [facility].    Follow Up Recommendations   (skilled nursing)     Does the patient have the potential to tolerate intense rehabilitation     Barriers to Discharge        Equipment Recommendations       Recommendations for Other Services    Frequency 7X/week   Plan Discharge plan remains appropriate    Precautions / Restrictions Precautions Precautions: Fall;Posterior Hip Precaution Comments: Pt could not recall any of her THP so re educated and demonstarted  Restrictions Weight Bearing Restrictions: Yes LLE Weight Bearing: Partial weight bearing LLE Partial Weight Bearing Percentage or Pounds: 50% WB    Pertinent Vitals/Pain C/o 9/10 L hip pain    Mobility  Bed Mobility Bed Mobility: Supine to Sit;Sitting - Scoot to Edge of Bed Supine to Sit: 2: Max assist Sitting - Scoot to [place] of Bed: 1: +1 Total assist Details for Bed Mobility Assistance: Pt requires increased time and 75% VC's on proper tech  Transfers Transfers: Sit to Stand;Stand to Sit Sit to Stand: 3: Mod assist;From bed;From toilet Stand to Sit: 3: Mod assist;To chair/3-in-1;To toilet Details for Transfer Assistance: 75% VC's on proper tech, hand placement and increased time to process then carry thru with instructions. Pt requires 100% VC's to avoid hip flex > 90'.  Ambulation/Gait Ambulation/Gait Assistance: 3: Mod assist Ambulation Distance (Feet): 6 Feet Assistive device: Rolling walker Ambulation/Gait Assistance Details: 75% VC's on proper tech, sequencing and PWB percaution. Pt requires inceased time and max encouragement to incease self assistance Gait Pattern: Step-to  pattern;Trunk flexed;Decreased stance time - left Gait velocity: decreased    PT Goals                                                              progressing    Visit Information  Last PT Received On: 12/20/11 Assistance Needed: +1    Subjective Data      Cognition       Balance     End of Session PT - End of Session Equipment Utilized During Treatment: Gait belt Activity Tolerance: Patient limited by pain Patient left: in chair;with call bell/phone within reach Nurse Communication: Mobility status   Felecia Shelling  PTA [facility]  Acute  Rehab Pager     [phone]

## 2011-12-21 NOTE — Progress Notes (Signed)
Clinical Social Work Department CLINICAL SOCIAL WORK PLACEMENT NOTE 12/21/2011  Patient:  [name], [name]  Account Number:  [id number] Admit date:  12/16/2011  Clinical Social Worker:  Leron Croak, CLINICAL SOCIAL WORKER  Date/time:  12/17/2011 03:24 PM  Clinical Social Work is seeking post-discharge placement for this patient at the following level of care:   SKILLED NURSING   (*CSW will update this form in Epic as items are completed)   12/17/2011  Patient/family provided with [facility] Department of Clinical Social Work's list of facilities offering this level of care within the geographic area requested by the patient (or if unable, by the patient's family).  12/17/2011  Patient/family informed of their freedom to choose among providers that offer the needed level of care, that participate in Medicare, Medicaid or managed care program needed by the patient, have an available bed and are willing to accept the patient.  12/17/2011  Patient/family informed of [facility]' ownership interest in [facility], as well as of the fact that they are under no obligation to receive care at this facility.  PASARR submitted to EDS on  PASARR number received from EDS on   FL2 transmitted to all facilities in geographic area requested by pt/family on  12/17/2011 FL2 transmitted to all facilities within larger geographic area on 12/17/2011  Patient informed that his/her managed care company has contracts with or will negotiate with  certain facilities, including the following:     Patient/family informed of bed offers received:  12/19/2011 Patient chooses bed at [facility] Physician recommends and patient chooses bed at    Patient to be transferred to [facility] on  12/20/2011 Patient to be transferred to facility by [facility]  The following physician request were entered in Epic:   Additional Comments:  Cori Razor LCSW  [phone]

## 2011-12-21 NOTE — OR Nursing (Signed)
Late entry: times corrected 12/21/2011 @ 1837 by j. Gaspar Skeeters, rn

## 2011-12-23 ENCOUNTER — Emergency Department (HOSPITAL_COMMUNITY)
Admission: EM | Admit: 2011-12-23 | Discharge: 2011-12-23 | Disposition: A | Discharge status: Home / Self Care | Payer: Medicare Other | Attending: Emergency Medicine | Admitting: Emergency Medicine

## 2011-12-23 ENCOUNTER — Encounter (HOSPITAL_COMMUNITY): Payer: Self-pay | Admitting: Emergency Medicine

## 2011-12-23 ENCOUNTER — Emergency Department (HOSPITAL_COMMUNITY): Payer: Medicare Other

## 2011-12-23 DIAGNOSIS — R4182 Altered mental status, unspecified: Secondary | ICD-10-CM | POA: Insufficient documentation

## 2011-12-23 DIAGNOSIS — Z8701 Personal history of pneumonia (recurrent): Secondary | ICD-10-CM | POA: Insufficient documentation

## 2011-12-23 DIAGNOSIS — Z8669 Personal history of other diseases of the nervous system and sense organs: Secondary | ICD-10-CM | POA: Insufficient documentation

## 2011-12-23 DIAGNOSIS — F411 Generalized anxiety disorder: Secondary | ICD-10-CM | POA: Insufficient documentation

## 2011-12-23 DIAGNOSIS — I129 Hypertensive chronic kidney disease with stage 1 through stage 4 chronic kidney disease, or unspecified chronic kidney disease: Secondary | ICD-10-CM | POA: Insufficient documentation

## 2011-12-23 DIAGNOSIS — M25559 Pain in unspecified hip: Secondary | ICD-10-CM | POA: Diagnosis present

## 2011-12-23 DIAGNOSIS — F329 Major depressive disorder, single episode, unspecified: Secondary | ICD-10-CM | POA: Diagnosis present

## 2011-12-23 DIAGNOSIS — N189 Chronic kidney disease, unspecified: Secondary | ICD-10-CM | POA: Insufficient documentation

## 2011-12-23 DIAGNOSIS — M129 Arthropathy, unspecified: Secondary | ICD-10-CM | POA: Insufficient documentation

## 2011-12-23 DIAGNOSIS — F3289 Other specified depressive episodes: Secondary | ICD-10-CM | POA: Insufficient documentation

## 2011-12-23 DIAGNOSIS — Z862 Personal history of diseases of the blood and blood-forming organs and certain disorders involving the immune mechanism: Secondary | ICD-10-CM | POA: Insufficient documentation

## 2011-12-23 DIAGNOSIS — Z79899 Other long term (current) drug therapy: Secondary | ICD-10-CM | POA: Insufficient documentation

## 2011-12-23 DIAGNOSIS — M169 Osteoarthritis of hip, unspecified: Secondary | ICD-10-CM | POA: Diagnosis present

## 2011-12-23 DIAGNOSIS — Z9889 Other specified postprocedural states: Secondary | ICD-10-CM | POA: Insufficient documentation

## 2011-12-23 DIAGNOSIS — F22 Delusional disorders: Secondary | ICD-10-CM

## 2011-12-23 LAB — HEPATIC FUNCTION PANEL
ALT: 13 U/L (ref 0–35)
AST: 26 U/L (ref 0–37)
Alkaline Phosphatase: 50 U/L (ref 39–117)
Bilirubin, Direct: <0.1 mg/dL (ref 0.0–0.3)
Total Bilirubin: 0.3 mg/dL (ref 0.3–1.2)

## 2011-12-23 LAB — URINALYSIS, ROUTINE W REFLEX MICROSCOPIC
Glucose, UA: NEGATIVE mg/dL
Ketones, ur: NEGATIVE mg/dL
pH: 7.5 (ref 5.0–8.0)

## 2011-12-23 LAB — CBC
Hemoglobin: 9 g/dL — ABNORMAL LOW (ref 12.0–15.0)
MCH: 30.9 pg (ref 26.0–34.0)
RBC: 2.91 MIL/uL — ABNORMAL LOW (ref 3.87–5.11)

## 2011-12-23 LAB — URINE MICROSCOPIC-ADD ON

## 2011-12-23 LAB — POCT I-STAT, CHEM 8
Calcium, Ion: 1.1 mmol/L — ABNORMAL LOW (ref 1.13–1.30)
HCT: 26 % — ABNORMAL LOW (ref 36.0–46.0)
Hemoglobin: 8.8 g/dL — ABNORMAL LOW (ref 12.0–15.0)
TCO2: 25 mmol/L (ref 0–100)

## 2011-12-23 MED ORDER — VALSARTAN-HYDROCHLOROTHIAZIDE 160-25 MG PO TABS: 2.0000 | ORAL_TABLET | Freq: Every day | ORAL | Status: DC

## 2011-12-23 MED ORDER — RIVAROXABAN 10 MG PO TABS
10.0000 mg | ORAL_TABLET | Freq: Every day | ORAL | Status: DC
  Filled 2011-12-23: qty 1

## 2011-12-23 MED ORDER — POTASSIUM CHLORIDE CRYS ER 20 MEQ PO TBCR
20.0000 meq | EXTENDED_RELEASE_TABLET | Freq: Two times a day (BID) | ORAL | Status: DC
  Administered 2011-12-23: 20 meq via ORAL
  Filled 2011-12-23: qty 1

## 2011-12-23 MED ORDER — HYDROCHLOROTHIAZIDE 50 MG PO TABS
50.0000 mg | ORAL_TABLET | Freq: Every day | ORAL | Status: DC
  Administered 2011-12-23: 50 mg via ORAL
  Filled 2011-12-23: qty 1

## 2011-12-23 MED ORDER — VITAMIN C 500 MG PO TABS
500.0000 mg | ORAL_TABLET | Freq: Every day | ORAL | Status: DC
  Administered 2011-12-23: 500 mg via ORAL
  Filled 2011-12-23: qty 1

## 2011-12-23 MED ORDER — PAROXETINE HCL 20 MG PO TABS
40.0000 mg | ORAL_TABLET | Freq: Every day | ORAL | Status: DC
  Administered 2011-12-23: 40 mg via ORAL
  Filled 2011-12-23: qty 2

## 2011-12-23 MED ORDER — MORPHINE SULFATE 2 MG/ML IJ SOLN
2.0000 mg | INTRAMUSCULAR | Status: DC | PRN
  Administered 2011-12-23: 2 mg via INTRAVENOUS
  Filled 2011-12-23: qty 1

## 2011-12-23 MED ORDER — LORATADINE 10 MG PO TABS
10.0000 mg | ORAL_TABLET | Freq: Every day | ORAL | Status: DC
  Administered 2011-12-23: 10 mg via ORAL
  Filled 2011-12-23: qty 1

## 2011-12-23 MED ORDER — SODIUM CHLORIDE 0.9 % IV SOLN
INTRAVENOUS | Status: DC
  Administered 2011-12-23 (×2): via INTRAVENOUS

## 2011-12-23 MED ORDER — IBUPROFEN 200 MG PO TABS
400.0000 mg | ORAL_TABLET | Freq: Three times a day (TID) | ORAL | Status: DC
  Administered 2011-12-23 (×2): 400 mg via ORAL
  Filled 2011-12-23 (×2): qty 2

## 2011-12-23 MED ORDER — PANTOPRAZOLE SODIUM 40 MG PO TBEC
40.0000 mg | DELAYED_RELEASE_TABLET | Freq: Every day | ORAL | Status: DC
  Administered 2011-12-23: 40 mg via ORAL
  Filled 2011-12-23: qty 1

## 2011-12-23 MED ORDER — DEXTROSE 5 % IV SOLN
1.0000 g | Freq: Once | INTRAVENOUS | Status: AC
  Administered 2011-12-23: 1 g via INTRAVENOUS
  Filled 2011-12-23: qty 10

## 2011-12-23 MED ORDER — METOPROLOL SUCCINATE ER 50 MG PO TB24
75.0000 mg | ORAL_TABLET | Freq: Every day | ORAL | Status: DC
  Administered 2011-12-23: 75 mg via ORAL
  Filled 2011-12-23: qty 1

## 2011-12-23 MED ORDER — IRBESARTAN 300 MG PO TABS
300.0000 mg | ORAL_TABLET | Freq: Every day | ORAL | Status: DC
  Administered 2011-12-23: 300 mg via ORAL
  Filled 2011-12-23 (×2): qty 1

## 2011-12-23 NOTE — Progress Notes (Signed)
EDP updated home health orders to include HHRN, PT/OT/aide and SW. Debbie at [place] contact to discuss additional SW services and new order in [employer]

## 2011-12-23 NOTE — ED Provider Notes (Addendum)
I assumed care at sign out from Dr. Theodoro Kalata. This is a 71 yo F with recent hip replacement in a rehab facility here with social work concerns. She was overhearing the staff whispering about her and thought they are not properly taking care of her. She no longer has those thoughts. She doesn't want to go back to the facility. Social work was involved and tried to set her up to go to other facilities. However, none were available at this time. She wants to go home with family and social work arranged home care and home PT. Stable for dc.   Labs below. UA contaminated (she finished a course of abx already). CT head, xrays unremarkable.   Medicine saw the patient and recommend avoiding narcotics as that will alter her sensorium. Otherwise, she just simply needs rehab.      Results for orders placed during the hospital encounter of 12/23/11  CBC      Component Value Range   WBC 8.3  4.0 - 10.5 K/uL   RBC 2.91 (*) 3.87 - 5.11 MIL/uL   Hemoglobin 9.0 (*) 12.0 - 15.0 g/dL   HCT 40.3 (*) 47.4 - 25.9 %   MCV 89.3  78.0 - 100.0 fL   MCH 30.9  26.0 - 34.0 pg   MCHC 34.6  30.0 - 36.0 g/dL   RDW 56.3  87.5 - 64.3 %   Platelets 299  150 - 400 K/uL  URINALYSIS, ROUTINE W REFLEX MICROSCOPIC      Component Value Range   Color, Urine YELLOW  YELLOW   APPearance TURBID (*) CLEAR   Specific Gravity, Urine 1.012  1.005 - 1.030   pH 7.5  5.0 - 8.0   Glucose, UA NEGATIVE  NEGATIVE mg/dL   Hgb urine dipstick LARGE (*) NEGATIVE   Bilirubin Urine MODERATE (*) NEGATIVE   Ketones, ur NEGATIVE  NEGATIVE mg/dL   Protein, ur 30 (*) NEGATIVE mg/dL   Urobilinogen, UA 2.0 (*) 0.0 - 1.0 mg/dL   Nitrite NEGATIVE  NEGATIVE   Leukocytes, UA MODERATE (*) NEGATIVE  URINE MICROSCOPIC-ADD ON      Component Value Range   Squamous Epithelial / LPF MANY (*) RARE   WBC, UA 0-2  <3 WBC/hpf   Bacteria, UA MANY (*) RARE   Urine-Other AMORPHOUS URATES/PHOSPHATES    POCT I-STAT, CHEM 8      Component Value Range   Sodium 135   135 - 145 mEq/L   Potassium 3.8  3.5 - 5.1 mEq/L   Chloride 99  96 - 112 mEq/L   BUN 13  6 - 23 mg/dL   Creatinine, Ser 3.29  0.50 - 1.10 mg/dL   Glucose, Bld 518 (*) 70 - 99 mg/dL   Calcium, Ion 8.41 (*) 1.13 - 1.30 mmol/L   TCO2 25  0 - 100 mmol/L   Hemoglobin 8.8 (*) 12.0 - 15.0 g/dL   HCT 66.0 (*) 63.0 - 16.0 %  AMMONIA      Component Value Range   Ammonia 19  11 - 60 umol/L  HEPATIC FUNCTION PANEL      Component Value Range   Total Protein 5.5 (*) 6.0 - 8.3 g/dL   Albumin 2.4 (*) 3.5 - 5.2 g/dL   AST 26  0 - 37 U/L   ALT 13  0 - 35 U/L   Alkaline Phosphatase 50  39 - 117 U/L   Total Bilirubin 0.3  0.3 - 1.2 mg/dL   Bilirubin, Direct <1.0  0.0 -  0.3 mg/dL   Indirect Bilirubin NOT CALCULATED  0.3 - 0.9 mg/dL   Dg Chest 2 View  16/11/9602  *RADIOLOGY REPORT*  Clinical Data: Preop left hip replacement  CHEST - 2 VIEW  Comparison: 07/26/2006  Findings: Heart size and vascularity are normal.  Mild scarring in the right middle lobe or lingula on the lateral view is unchanged. Negative for heart failure.  Negative for infiltrate or mass. Lungs otherwise clear. Small hiatal hernia.  IMPRESSION: No active cardiopulmonary disease.   Original Report Authenticated By: Camelia Phenes, M.D.    Dg Hip Complete Left  12/23/2011  *RADIOLOGY REPORT*  Clinical Data: Left hip pain since hip surgery last week.  LEFT HIP - COMPLETE 2+ VIEW  Comparison: Left hip radiographs performed 12/16/2011  Findings: There is no evidence of fracture or dislocation.  The patient's bilateral total hip arthroplasties appear intact, without evidence of loosening; the right total hip arthroplasty is incompletely imaged on this study.  The proximal left femur appears intact.  Mild degenerative change is noted at the lower lumbar spine.  The sacroiliac joints are unremarkable in appearance.  The visualized bowel gas pattern is grossly unremarkable in appearance.  Skin staples are noted overlying the left hip joint; residual  soft tissue air is noted.  IMPRESSION:  1.  No evidence of fracture or dislocation. 2.  Bilateral total hip arthroplasties appear intact, without evidence of loosening. 2.  Residual soft tissue air noted at the site of surgery.   Original Report Authenticated By: Tonia Ghent, M.D.    Dg Hip Complete Left  12/12/2011  *RADIOLOGY REPORT*  Clinical Data: Preop hip replacement on the left  LEFT HIP - COMPLETE 2+ VIEW  Comparison: 08/03/2006  Findings: Severe degenerative change in the left hip joint with cartilage loss, subchondral sclerosis, and mild collapse of the femoral head.  This has developed since the prior study.  Question underlying AVN of the femoral head.  Prior right hip replacement unchanged from  the   prior study.  IMPRESSION: Severe degenerative change in the left hip joint with marked progression from 2008.  Negative for fracture.   Original Report Authenticated By: Camelia Phenes, M.D.    Ct Head Wo Contrast  12/23/2011  *RADIOLOGY REPORT*  Clinical Data: Altered mental status.  Out at tarry elucidation this.  Recent hip surgery.  CT HEAD WITHOUT CONTRAST  Technique:  Contiguous axial images were obtained from the base of the skull through the vertex without contrast.  Comparison: None.  Findings: No acute intracranial abnormality is present. Specifically, there is no evidence for acute infarct, hemorrhage, mass, hydrocephalus, or extra-axial fluid collection.  The paranasal sinuses and mastoid air cells are clear.  The globes and orbits are intact.  The osseous skull is intact.  IMPRESSION: Negative CT of the head.   Original Report Authenticated By: Marin Roberts, M.D.    Dg Pelvis Portable  12/16/2011  *RADIOLOGY REPORT*  Clinical Data: 71 year old female status post left hip replacement.  PORTABLE PELVIS  Comparison: 08/03/2006.  Findings: Portable AP view 1642 hours.  New left total hip arthroplasty.  Hardware components appear intact normally aligned. Postoperative changes to  the surrounding soft tissues.  No unexpected osseous changes.  Stable right total hip arthroplasty.  IMPRESSION: Left total hip arthroplasty, no adverse features.   Original Report Authenticated By: Erskine Speed, M.D.    Dg Hip Portable 1 View Left  12/16/2011  *RADIOLOGY REPORT*  Clinical Data: 71 year old female status post left hip  replacement.  PORTABLE LEFT HIP - 1 VIEW  Comparison: 12/12/2011.  1644 hours the same day.  Findings: AP portable view of the left hip at 1646 hours.  New left total hip arthroplasty, hardware components appear intact and normally aligned in the AP dimension.  Overlying skin staples and postoperative changes to the soft tissues.  Visible left hemi pelvis appears stable.  IMPRESSION: Left total hip arthroplasty with no adverse features.   Original Report Authenticated By: Erskine Speed, M.D.       Richardean Canal, MD 12/23/11 1515  Richardean Canal, MD 12/23/11 1515

## 2011-12-23 NOTE — ED Notes (Signed)
ZOX:[id number]<AV> Expected date:12/23/11<BR> Expected time: 1:17 AM<BR> Means of arrival:Ambulance<BR> Comments:<BR> Hip pain

## 2011-12-23 NOTE — Clinical Social Work Note (Signed)
CSW received a call back from [facility] (pt 2nd choice SNF) Sheliah Hatch stated they do not have female beds available.  This is the second denial for pt.  CSW will provide pt with SNF list and pt will need to make other arrangements.  Per earlier conversation with pt, family will be able to assist in getting pt placed in another facility. Vickii Penna, LCSWA [phone]  Clinical Social Work

## 2011-12-23 NOTE — ED Notes (Signed)
Visitor at bedside. Pt talking on the phone. Pt reports she is doing better than she initially was

## 2011-12-23 NOTE — ED Notes (Signed)
Pt assisted with bedside commode and returned to bed. Stretcher wheels locked and in lowest position. Side rails up x 2. Family returned to bedside

## 2011-12-23 NOTE — Clinical Social Work Note (Signed)
CSW contacted [facility], SNF (first choice of pt) who stated they were at capacity and unable to take pt.  CSW also contacted [place], per pt request.  CSW is awaiting on a call back from SNF.    Pt made aware she cannot remain in ED pending placement.  CSW will continue to follow and assist while pt in ED. Vickii Penna, LCSWA [phone]  Clinical Social Work

## 2011-12-23 NOTE — ED Provider Notes (Signed)
History     CSN: [id number]  Arrival date & time 12/23/11  0134   First MD Initiated Contact with Patient 12/23/11 0148      Chief Complaint  Patient presents with  . Hip Pain    (Consider location/radiation/quality/duration/timing/severity/associated sxs/prior treatment) HPI HX per PT, became paranoid at SNF last night, having worsening L hip pain s/p recent hip surgery, called 911 from her room and states she will not go back to that facility. Pain is sharp and not radiating, persistent despite percocet at facility. Worse with movement. No F/C, recent UTI just finished ABx Past Medical History  Diagnosis Date  . Hypertension   . Seasonal allergies   . Arthritis   . Anxiety   . Depression   . Pneumonia     hx of   . Neuromuscular disorder     numbness in middle toes on left leg   . Chronic kidney disease     hx of uti   . Anemia     hx of years ago     Past Surgical History  Procedure Date  . Vaginal hysterectomy 1981  . Rectocele repair 1993  . Total hip arthroplasty 2008    right  . Back surgery 2006  . Total hip arthroplasty 12/16/2011    Procedure: TOTAL HIP ARTHROPLASTY;  Surgeon: Javier Docker, MD;  Location: [facility];  Service: Orthopedics;  Laterality: Left;    Family History  Problem Relation Age of Onset  . Colon cancer Mother 58  . Stomach cancer Neg Hx   . Lung cancer Mother   . Pancreatic cancer Father   . Heart failure Father   . Skin cancer Sister     History  Substance Use Topics  . Smoking status: Never Smoker   . Smokeless tobacco: Never Used  . Alcohol Use: No    OB History    Grav Para Term Preterm Abortions TAB SAB Ect Mult Living                  Review of Systems  Constitutional: Negative for fever and chills.  HENT: Negative for neck pain and neck stiffness.   Eyes: Negative for pain.  Respiratory: Negative for shortness of breath.   Cardiovascular: Negative for chest pain.  Gastrointestinal: Negative for abdominal  pain.  Genitourinary: Negative for dysuria.  Skin: Negative for rash.  Neurological: Negative for headaches.  Psychiatric/Behavioral: Positive for confusion.  All other systems reviewed and are negative.    Allergies  Ciprocin-fluocin-procin; Citric acid; and Cleocin  Home Medications   Current Outpatient Rx  Name  Route  Sig  Dispense  Refill  . CALCIUM-D PO   Oral   Take 1,200 mg by mouth daily.          . DESONIDE CREA-MOISTURIZING LOT 0.05 % EX KIT   Apply externally   Apply topically. Uses every other day as a preventive for fungus  Under breasts and abdominal area         . OMEGA-3 FATTY ACIDS 1000 MG PO CAPS   Oral   Take by mouth daily.         [name] LORATADINE 10 MG PO TABS   Oral   Take 10 mg by mouth daily.         [name] LORAZEPAM 1 MG PO TABS   Oral   Take 1 mg by mouth at bedtime. scheduled         . METHOCARBAMOL 500 MG PO TABS  Oral   Take 500 mg by mouth 3 (three) times daily as needed. For muscle spasms         . ONE-DAILY MULTI VITAMINS PO TABS   Oral   Take 1 tablet by mouth daily.         . OXYCODONE-ACETAMINOPHEN 5-325 MG PO TABS   Oral   Take 1-2 tablets by mouth every 4 (four) hours as needed for pain.   60 tablet   0   . PAROXETINE HCL 40 MG PO TABS   Oral   Take 40 mg by mouth daily before breakfast.          . POTASSIUM CHLORIDE CRYS ER 20 MEQ PO TBCR   Oral   Take 20 mEq by mouth 2 (two) times daily.          [name] RIVAROXABAN 10 MG PO TABS   Oral   Take 1 tablet (10 mg total) by mouth daily.   21 tablet   1   . TETRAHYDROZOLINE-ZN SULFATE 0.05-0.25 % OP SOLN   Both Eyes   Place 1 drop into both eyes every evening. scheduled         . TOPROL XL 50 MG PO TB24   Oral   Take 75 mg by mouth daily before breakfast. Takes 1 and one half tablet daily         . VALSARTAN-HYDROCHLOROTHIAZIDE 160-25 MG PO TABS   Oral   Take 2 tablets by mouth daily before breakfast.          . VITAMIN C 500 MG PO TABS   Oral    Take 500 mg by mouth daily.           BP 117/53  Pulse 74  Temp 97.4 F (36.3 C) (Oral)  Resp 20  SpO2 100%  Physical Exam  Constitutional: She is oriented to person, place, and time. She appears well-developed and well-nourished.  HENT:  Head: Normocephalic and atraumatic.  Eyes: Conjunctivae normal and EOM are normal. Pupils are equal, round, and reactive to light.  Neck: Trachea normal. Neck supple. No thyromegaly present.  Cardiovascular: Normal rate, regular rhythm, S1 normal, S2 normal and normal pulses.     No systolic murmur is present   No diastolic murmur is present  Pulses:      Radial pulses are 2+ on the right side, and 2+ on the left side.  Pulmonary/Chest: Effort normal and breath sounds normal. She has no wheezes. She has no rhonchi. She has no rales. She exhibits no tenderness.  Abdominal: Soft. Normal appearance and bowel sounds are normal. There is no tenderness. There is no CVA tenderness and negative Murphy's sign.  Musculoskeletal:       LLE with hip tenderness, no erythema or inc warmth, has associated LLE swelling , no calf tenderness or cords, neg Homans. Distal n/v intact  Neurological: She is alert and oriented to person, place, and time. She has normal strength. No cranial nerve deficit or sensory deficit. GCS eye subscore is 4. GCS verbal subscore is 5. GCS motor subscore is 6.  Skin: Skin is warm and dry. No rash noted. She is not diaphoretic.  Psychiatric: Her speech is normal.       Cooperative and appropriate    ED Course  Procedures (including critical care time)  Results for orders placed during the hospital encounter of 12/23/11  CBC      Component Value Range   WBC 8.3  4.0 - 10.5 K/uL  RBC 2.91 (*) 3.87 - 5.11 MIL/uL   Hemoglobin 9.0 (*) 12.0 - 15.0 g/dL   HCT 60.4 (*) 54.0 - 98.1 %   MCV 89.3  78.0 - 100.0 fL   MCH 30.9  26.0 - 34.0 pg   MCHC 34.6  30.0 - 36.0 g/dL   RDW 19.1  47.8 - 29.5 %   Platelets 299  150 - 400 K/uL    URINALYSIS, ROUTINE W REFLEX MICROSCOPIC      Component Value Range   Color, Urine YELLOW  YELLOW   APPearance TURBID (*) CLEAR   Specific Gravity, Urine 1.012  1.005 - 1.030   pH 7.5  5.0 - 8.0   Glucose, UA NEGATIVE  NEGATIVE mg/dL   Hgb urine dipstick LARGE (*) NEGATIVE   Bilirubin Urine MODERATE (*) NEGATIVE   Ketones, ur NEGATIVE  NEGATIVE mg/dL   Protein, ur 30 (*) NEGATIVE mg/dL   Urobilinogen, UA 2.0 (*) 0.0 - 1.0 mg/dL   Nitrite NEGATIVE  NEGATIVE   Leukocytes, UA MODERATE (*) NEGATIVE  URINE MICROSCOPIC-ADD ON      Component Value Range   Squamous Epithelial / LPF MANY (*) RARE   WBC, UA 0-2  <3 WBC/hpf   Bacteria, UA MANY (*) RARE   Urine-Other AMORPHOUS URATES/PHOSPHATES    POCT I-STAT, CHEM 8      Component Value Range   Sodium 135  135 - 145 mEq/L   Potassium 3.8  3.5 - 5.1 mEq/L   Chloride 99  96 - 112 mEq/L   BUN 13  6 - 23 mg/dL   Creatinine, Ser 6.21  0.50 - 1.10 mg/dL   Glucose, Bld 308 (*) 70 - 99 mg/dL   Calcium, Ion 6.57 (*) 1.13 - 1.30 mmol/L   TCO2 25  0 - 100 mmol/L   Hemoglobin 8.8 (*) 12.0 - 15.0 g/dL   HCT 84.6 (*) 96.2 - 95.2 %  AMMONIA      Component Value Range   Ammonia 19  11 - 60 umol/L  HEPATIC FUNCTION PANEL      Component Value Range   Total Protein 5.5 (*) 6.0 - 8.3 g/dL   Albumin 2.4 (*) 3.5 - 5.2 g/dL   AST 26  0 - 37 U/L   ALT 13  0 - 35 U/L   Alkaline Phosphatase 50  39 - 117 U/L   Total Bilirubin 0.3  0.3 - 1.2 mg/dL   Bilirubin, Direct <8.4  0.0 - 0.3 mg/dL   Indirect Bilirubin NOT CALCULATED  0.3 - 0.9 mg/dL   Dg Chest 2 View  13/24/4010  *RADIOLOGY REPORT*  Clinical Data: Preop left hip replacement  CHEST - 2 VIEW  Comparison: 07/26/2006  Findings: Heart size and vascularity are normal.  Mild scarring in the right middle lobe or lingula on the lateral view is unchanged. Negative for heart failure.  Negative for infiltrate or mass. Lungs otherwise clear. Small hiatal hernia.  IMPRESSION: No active cardiopulmonary disease.    Original Report Authenticated By: Camelia Phenes, M.D.    Dg Hip Complete Left  12/23/2011  *RADIOLOGY REPORT*  Clinical Data: Left hip pain since hip surgery last week.  LEFT HIP - COMPLETE 2+ VIEW  Comparison: Left hip radiographs performed 12/16/2011  Findings: There is no evidence of fracture or dislocation.  The patient's bilateral total hip arthroplasties appear intact, without evidence of loosening; the right total hip arthroplasty is incompletely imaged on this study.  The proximal left femur appears intact.  Mild  degenerative change is noted at the lower lumbar spine.  The sacroiliac joints are unremarkable in appearance.  The visualized bowel gas pattern is grossly unremarkable in appearance.  Skin staples are noted overlying the left hip joint; residual soft tissue air is noted.  IMPRESSION:  1.  No evidence of fracture or dislocation. 2.  Bilateral total hip arthroplasties appear intact, without evidence of loosening. 2.  Residual soft tissue air noted at the site of surgery.   Original Report Authenticated By: Tonia Ghent, M.D.    Dg Hip Complete Left  12/12/2011  *RADIOLOGY REPORT*  Clinical Data: Preop hip replacement on the left  LEFT HIP - COMPLETE 2+ VIEW  Comparison: 08/03/2006  Findings: Severe degenerative change in the left hip joint with cartilage loss, subchondral sclerosis, and mild collapse of the femoral head.  This has developed since the prior study.  Question underlying AVN of the femoral head.  Prior right hip replacement unchanged from  the   prior study.  IMPRESSION: Severe degenerative change in the left hip joint with marked progression from 2008.  Negative for fracture.   Original Report Authenticated By: Camelia Phenes, M.D.    Ct Head Wo Contrast  12/23/2011  *RADIOLOGY REPORT*  Clinical Data: Altered mental status.  Out at tarry elucidation this.  Recent hip surgery.  CT HEAD WITHOUT CONTRAST  Technique:  Contiguous axial images were obtained from the base of the  skull through the vertex without contrast.  Comparison: None.  Findings: No acute intracranial abnormality is present. Specifically, there is no evidence for acute infarct, hemorrhage, mass, hydrocephalus, or extra-axial fluid collection.  The paranasal sinuses and mastoid air cells are clear.  The globes and orbits are intact.  The osseous skull is intact.  IMPRESSION: Negative CT of the head.   Original Report Authenticated By: Marin Roberts, M.D.    Dg Pelvis Portable  12/16/2011  *RADIOLOGY REPORT*  Clinical Data: 71 year old female status post left hip replacement.  PORTABLE PELVIS  Comparison: 08/03/2006.  Findings: Portable AP view 1642 hours.  New left total hip arthroplasty.  Hardware components appear intact normally aligned. Postoperative changes to the surrounding soft tissues.  No unexpected osseous changes.  Stable right total hip arthroplasty.  IMPRESSION: Left total hip arthroplasty, no adverse features.   Original Report Authenticated By: Erskine Speed, M.D.    Dg Hip Portable 1 View Left  12/16/2011  *RADIOLOGY REPORT*  Clinical Data: 71 year old female status post left hip replacement.  PORTABLE LEFT HIP - 1 VIEW  Comparison: 12/12/2011.  1644 hours the same day.  Findings: AP portable view of the left hip at 1646 hours.  New left total hip arthroplasty, hardware components appear intact and normally aligned in the AP dimension.  Overlying skin staples and postoperative changes to the soft tissues.  Visible left hemi pelvis appears stable.  IMPRESSION: Left total hip arthroplasty with no adverse features.   Original Report Authenticated By: Erskine Speed, M.D.    Ortho consult requested - no call back.  MED consult, d/w Dr Toniann Fail requested CT brain and ammonia. Results reviewed and re-consulted MED, this time d/w Rama, evaluated bedside and recs no indication for admit, recs social work consult for placement and start NSAIDs, stop narcotics.  Social worker consult pending, Dr Silverio Lay  aware and will dispo   MDM    L hip pain and period of delusion, still has UTI treated ABx, work up as above. Imaging, UA, labs, U cx pending  Sunnie Nielsen, MD 12/23/11 [phone]

## 2011-12-23 NOTE — ED Notes (Signed)
Patient transported to X-ray 

## 2011-12-23 NOTE — Clinical Social Work Note (Signed)
CSW was asked by son to contact [facility].  CSW will fax information to SNF.  Once SNF makes a decision, CSW will present to pt and family.  If SNF declines pt, pt will need to be d/c either home or to back to [facility].  Pt and family informed that pt cannot remain in ED once medically cleared to d/c and safe d/c plan is in place. Vickii Penna, LCSWA [phone]  Clinical Social Work

## 2011-12-23 NOTE — Progress Notes (Signed)
[facility] ED CM assisted EDP with orders and updated ED RN

## 2011-12-23 NOTE — ED Notes (Signed)
Selena Batten, Case Manager at bedside with RN, PT, 2 son's and daughter in-law to discuss plan of care.

## 2011-12-23 NOTE — ED Notes (Signed)
Please notify Nicki, Admissions Nurse at [facility] of disposition - [phone]. dph

## 2011-12-23 NOTE — Progress Notes (Signed)
Per discussion with CSW colleage, pt was admitted to the ED from [facility] and patient does not wish to return. CSW colleague initiate snf placement for patient while being evaluated in ED. Per discussion with csw colleague and rn case manager, plan is for patient to return home with family who will provide 24 hour care for patient while awaiting new placement. CSW contacted several facilities that patient family was interested in, however none were available.   Pt has been set up with home health services through [facility] and a home health social worker will assist with pt placement from home.   .No further Clinical Social Work needs, signing off.   Catha Gosselin, LCSWA  [phone] .12/23/2011 1622pm

## 2011-12-23 NOTE — Progress Notes (Signed)
[facility] ED CM consulted by ED SW after pt refused to return to previous snf. Pt wanting home health placement CM spoke with pt, provided home health choices, reviewed description of home health [facility]) services and answered questions about referral process and coverage.  Pt choice is [place] home care Cm contacted Rich Fuchs HH/DME coordinator and provided referral information to include pt ht 5'6" wt 203-121 lbs, coverage, discharging to primary caregiver Dennie Bible at [address] [place] cell [phone] home [phone] and with need for [facility], PT/OT, aide, standard walker and bedside commode (delivery) Cm reviewed with pt that she should receive a call today, DME should be delivered today and possible setup for first services Saturday or Tuesday considering that this is veterans day weekend Pt provided with list of choices that includes [facility] address and contact number Pt voiced understanding and appreciation of services rendered

## 2011-12-23 NOTE — ED Notes (Signed)
Social worker spoke with family member by phone. Family member stepped out of room and stated social worker will being coming down to talk with family member in person.

## 2011-12-23 NOTE — ED Notes (Signed)
Social worker at bedside with pt.

## 2011-12-23 NOTE — Progress Notes (Signed)
[facility] ED CM noted one of pt's son not present earlier in the day speaking with ED RN and inquiring about possible admission. CM reviewed with son medicare guidelines and CM review of ED clinicals for 12/23/11, EDP d/c home orders, differences in home health & private duty, orders for home health services to include RN, PT/OT/aide and SW to assist with further placement since pt did not want to return to previous snf to have the snf assist with transfer and no snf bed availability per ED SW. CM explained to son that ED SW had completed the bed search as pt requested prior to contacting CM to assist with [facility].  Pt alert and oriented x 3 and demonstrated good decision making skills when she provided CM her choice for d/c plan while a female friend was at her bedside.  Female friend had questioned pt but pt respond but with clarity of her d/c plan CM explained to son the pt made a choice for d/c home with Iantha Fallen because "he is the only one without lots of steps and would be available"  Pt reported 3 sons have 2 or more steps in their homes and all work with children. Pt informed Cm about plan of care until possible next placement as d/c with ptar to [facility] in her room with bedside commode and standard walker near bed and HH aide to assist with bathing until RN/PT/OT staff able to assist with monitoring and improvement with strengthening her to walk.  Pt reported to CM and her female friend she had walked to beside commode while in [facility] ED and had PT sessions in snf. Cm explained to son that with pt alert and oriented Cm completed pt's request for d/c plan.  Son stated it is close to 5 pm and he is aware snf staff are probably gone He reports he has friend working in a snf assisting with placement. Son voiced understanding of pt's choice of d/c plan CM answered all of his questions with responses pt had previously provided Encouraged pt and family to share preferred list of snf with hhsw and to visit local snfs.  Pt  informed CM she had visited snfs prior to her surgery and would be able to provide HHSW assistance Pt has a list of [place] snf Female friend of one of the sons jotted down snf preferences as pt stated them ([facility], [facility], etc) Cm provided a list of private duty nursing services to pt

## 2011-12-23 NOTE — ED Notes (Signed)
Son at bedside talking with Child psychotherapist on Phone per request
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
## 2011-12-23 NOTE — Clinical Social Work Note (Signed)
CSW met with pt at bedside.  Pt stated she was arrived to [facility] from [facility] where she was a resident for short-term-rehab.  Pt stated she did not want to go back.  She was unsure if what she remembered was an actual delusion.  Pt requested CSW check into [facility].  CSW advised pt that placement from the ED is not a common practice and may be difficult or may not be able to be done at all.  CSW also advised pt that CSW will contact both facilities to check on the possibility of this transfer happening from the ED.  Pt was advised that if CSW was unable to assist that this could possibly be done while pt is at home with family.  Pt understood and is agreeable to plan.  Vickii Penna, LCSWA [phone]  Clinical Social Work

## 2011-12-23 NOTE — Progress Notes (Signed)
[facility] ED CM consulted by ED RN to assist with updating pt's sons about pt's d/c plan.  Reviewed with sons and pt the [place] referral, primary caregiver, private duty services, and DME Reviewed availability of [facility] for transport home as needed.  Prior to [facility] and RN leaving pt and twin sons agreed to disposition plan and voiced understanding and appreciation of services rendered. Sons states may rotate for home care Iantha Fallen, Pt's female friend is 27.  ED SW updated
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
## 2011-12-23 NOTE — ED Notes (Signed)
Pt states she will be discharged to [facility] (family friend, Phone number [phone]). Rocky Link called per pt request. Rocky Link stated he is awaiting pt's arrival. [facility] called for transport. Family at bedside aware of plan.

## 2011-12-23 NOTE — ED Notes (Signed)
Visitor at bedside.

## 2011-12-23 NOTE — ED Notes (Signed)
Pt eating peanut butter and crackers 

## 2011-12-23 NOTE — ED Notes (Signed)
Per EMS. Patient had total hip replacement, c/o pain since that time. States medications given for pain post-op (per pt Oxycodone) is not helping. Patient states she thinks she has a UTI, unable to state symptoms.

## 2011-12-27 NOTE — OR Nursing (Signed)
Surgical time correction done 12/27/2011 @ 1511. Verlon Setting, rn

## 2011-12-28 NOTE — OR Nursing (Signed)
Further correction to surgical times done. 12/28/2011 @ 1920

## 2013-02-11 ENCOUNTER — Telehealth: Payer: Self-pay | Admitting: *Deleted

## 2013-02-11 MED ORDER — VALSARTAN-HYDROCHLOROTHIAZIDE 320-25 MG PO TABS: 1.0000 | ORAL_TABLET | Freq: Every day | ORAL | Status: DC

## 2013-02-11 NOTE — Telephone Encounter (Signed)
Formulary change 2015

## 2013-04-25 ENCOUNTER — Encounter: Payer: Self-pay | Admitting: General Surgery

## 2013-04-30 ENCOUNTER — Encounter: Payer: Self-pay | Admitting: Cardiology

## 2013-04-30 ENCOUNTER — Ambulatory Visit (INDEPENDENT_AMBULATORY_CARE_PROVIDER_SITE_OTHER): Payer: Medicare Other | Admitting: Cardiology

## 2013-04-30 VITALS — BP 130/78 | Resp 61 | Ht 66.0 in | Wt 201.0 lb

## 2013-04-30 DIAGNOSIS — I4510 Unspecified right bundle-branch block: Secondary | ICD-10-CM

## 2013-04-30 DIAGNOSIS — I10 Essential (primary) hypertension: Secondary | ICD-10-CM | POA: Insufficient documentation

## 2013-04-30 NOTE — Patient Instructions (Signed)
Your physician recommends that you continue on your current medications as directed. Please refer to the Current Medication list given to you today.  Your physician wants you to follow-up in: 1 Year with Dr Turner You will receive a reminder letter in the mail two months in advance. If you don't receive a letter, please call our office to schedule the follow-up appointment.  

## 2013-04-30 NOTE — Progress Notes (Signed)
[place] [place], [place] [address], [place]  [address] Phone: [phone] Fax:  [phone]  Date:  04/30/2013   ID:  [name], [name] [birth year], MRN [id number]  PCP:  Horton Finer, MD  Cardiologist:  [name], MD     History of Present Illness: [name] is a 73 y.o. female with a history of HTN who presents today for followup.  She is doing well.  She denies any chest pain, SOB, DOE, LE edema, dizziness, palpitations or syncope.   Wt Readings from Last 3 Encounters:  04/30/13 201 lb (91.173 kg)  12/16/11 203 lb 14.8 oz (92.5 kg)  12/16/11 203 lb 14.8 oz (92.5 kg)     Past Medical History  Diagnosis Date  . Seasonal allergies   . Arthritis   . Anxiety   . Depression   . Pneumonia     hx of   . Neuromuscular disorder     numbness in middle toes on left leg   . Chronic kidney disease     hx of uti   . Anemia     hx of years ago   . DJD (degenerative joint disease)   . Syncope and collapse     in the setting of diarrheal illness-Dr Caitrin Pendergraph  . Colon polyp   . Lumbar radiculopathy     S/P ESI- Dr Maxie Better Dr. Nelva Bush  . Hypertension     Current Outpatient Prescriptions  Medication Sig Dispense Refill  . Calcium Carbonate-Vitamin D (CALCIUM-D PO) Take 1,200 mg by mouth daily.       [name] Desonide Crea-Moisturizing Lot 0.05 % KIT Apply topically. Uses every other day as a preventive for fungus  Under breasts and abdominal area      . fish oil-omega-3 fatty acids 1000 MG capsule Take by mouth daily.      [name] loratadine (CLARITIN) 10 MG tablet Take 10 mg by mouth daily.      [name] LORazepam (ATIVAN) 1 MG tablet Take 1 mg by mouth at bedtime. scheduled      . methocarbamol (ROBAXIN) 500 MG tablet Take 500 mg by mouth 3 (three) times daily as needed. For muscle spasms      . Multiple Vitamin (MULTIVITAMIN) tablet Take 1 tablet by mouth daily.      [name] oxyCODONE-acetaminophen (PERCOCET) 5-325 MG per tablet Take 1-2 tablets by mouth every 4 (four) hours as needed for  pain.  60 tablet  0  . PARoxetine (PAXIL) 40 MG tablet Take 40 mg by mouth daily before breakfast.       . potassium chloride SA (K-DUR,KLOR-CON) 20 MEQ tablet Take 20 mEq by mouth 2 (two) times daily.       [name] tetrahydrozoline-zinc (VISINE-AC) 0.05-0.25 % ophthalmic solution Place 1 drop into both eyes every evening. scheduled      . TOPROL XL 50 MG 24 hr tablet Take 75 mg by mouth daily before breakfast. Takes 1 and one half tablet daily      . valsartan-hydrochlorothiazide (DIOVAN-HCT) 320-25 MG per tablet Take 1 tablet by mouth daily.      . vitamin C (ASCORBIC ACID) 500 MG tablet Take 500 mg by mouth daily.       No current facility-administered medications for this visit.    Allergies:    Allergies  Allergen Reactions  . Ciprocin-Fluocin-Procin [Fluocinolone] Diarrhea  . Citric Acid     Headache, swelling around eyes and mouth  . Cleocin [Clindamycin Hcl] Diarrhea  . Cranberry Hives  .  Iodine   . Septra [Sulfamethoxazole-Tmp Ds]     Social History:  The patient  reports that she has never smoked. She has never used smokeless tobacco. She reports that she does not drink alcohol or use illicit drugs.   Family History:  The patient's family history includes Colon cancer (age of onset: 62) in her mother; Heart failure in her father; Lung cancer in her mother; Pancreatic cancer in her father; Skin cancer in her sister. There is no history of Stomach cancer.   ROS:  Please see the history of present illness.      All other systems reviewed and negative.   PHYSICAL EXAM: VS:  Ht _0  (1.676 m)  Wt 201 lb (91.173 kg)  BMI 32.46 kg/m2 Well nourished, well developed, in no acute distress HEENT: normal Neck: no JVD Cardiac:  normal S1, S2; RRR; no murmur Lungs:  clear to auscultation bilaterally, no wheezing, rhonchi or rales Abd: soft, nontender, no hepatomegaly Ext: no edema Skin: warm and dry Neuro:  CNs 2-12 intact, no focal abnormalities noted  EKG:    NSR with  RBBB  ASSESSMENT AND PLAN:  1. HTN - well controlled  - Conntinue Diovan HCT/Metoprolol 2. Chronic RBBB   Followup with me in 1 year  Signed, [name], MD 04/30/2013 2:46 PM

## 2013-06-18 ENCOUNTER — Other Ambulatory Visit: Payer: Self-pay | Admitting: Cardiology

## 2013-07-14 ENCOUNTER — Other Ambulatory Visit: Payer: Self-pay | Admitting: Cardiology

## 2013-07-17 IMAGING — CR DG HIP (WITH OR WITHOUT PELVIS) 2-3V*L*
4 series · 4 of 4 positions shown · non-contrast
Comparison: Left hip radiographs performed 12/16/2011

CLINICAL DATA: Left hip pain since hip surgery last week.

LEFT HIP - COMPLETE 2+ VIEW

[t pelvis a.p.]
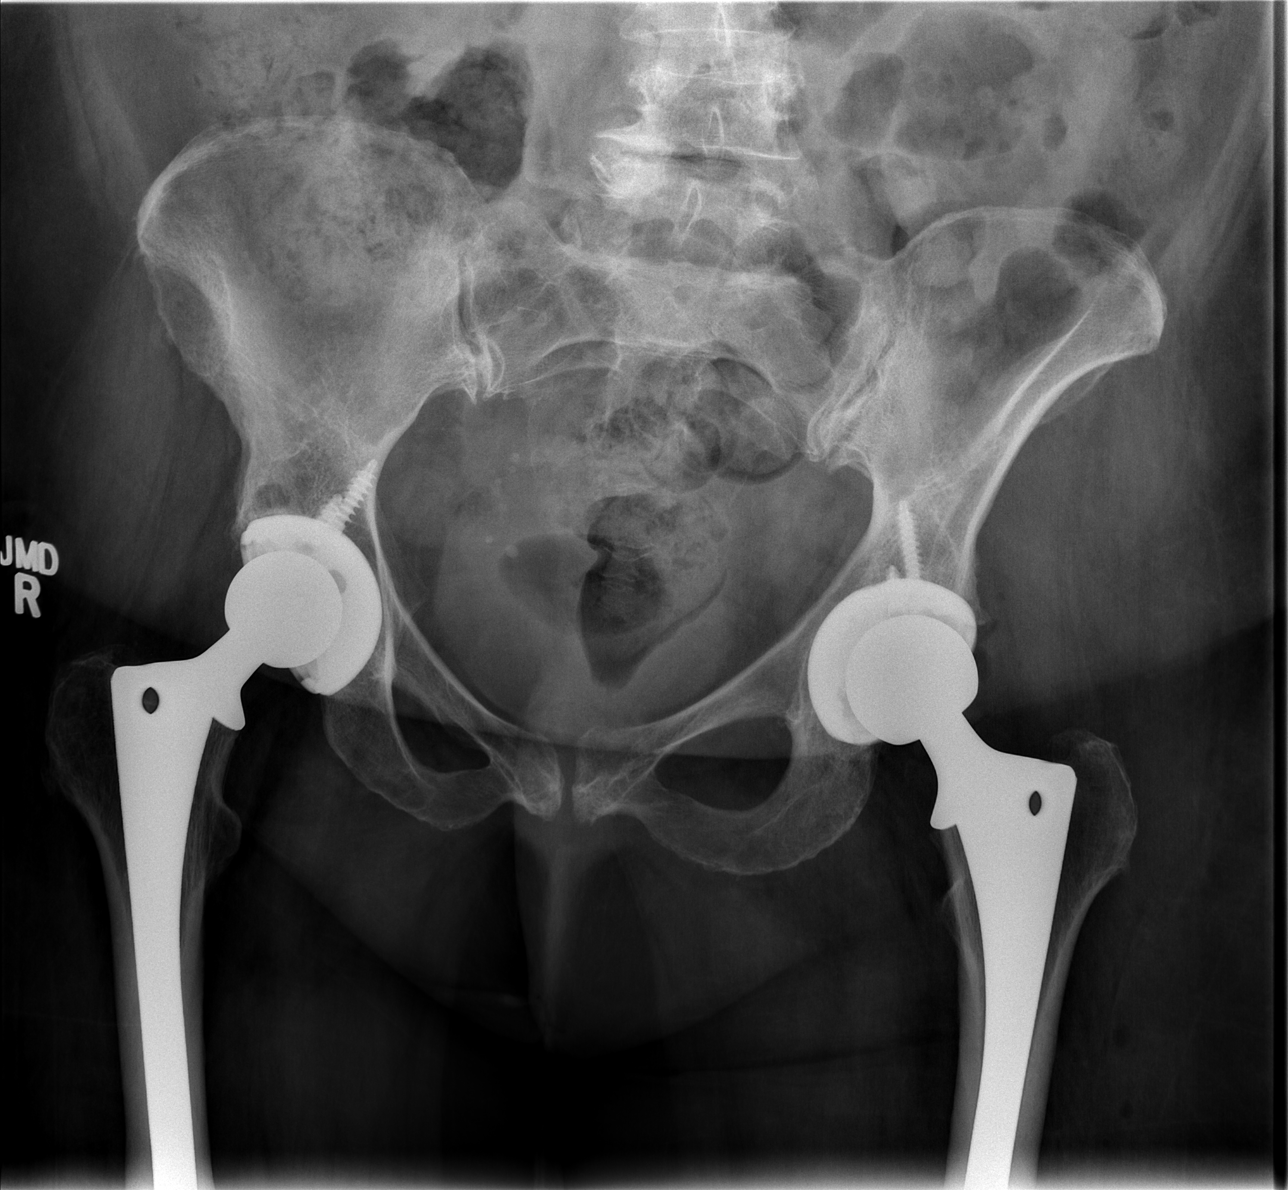

[t hip ap left (1 of 2)]
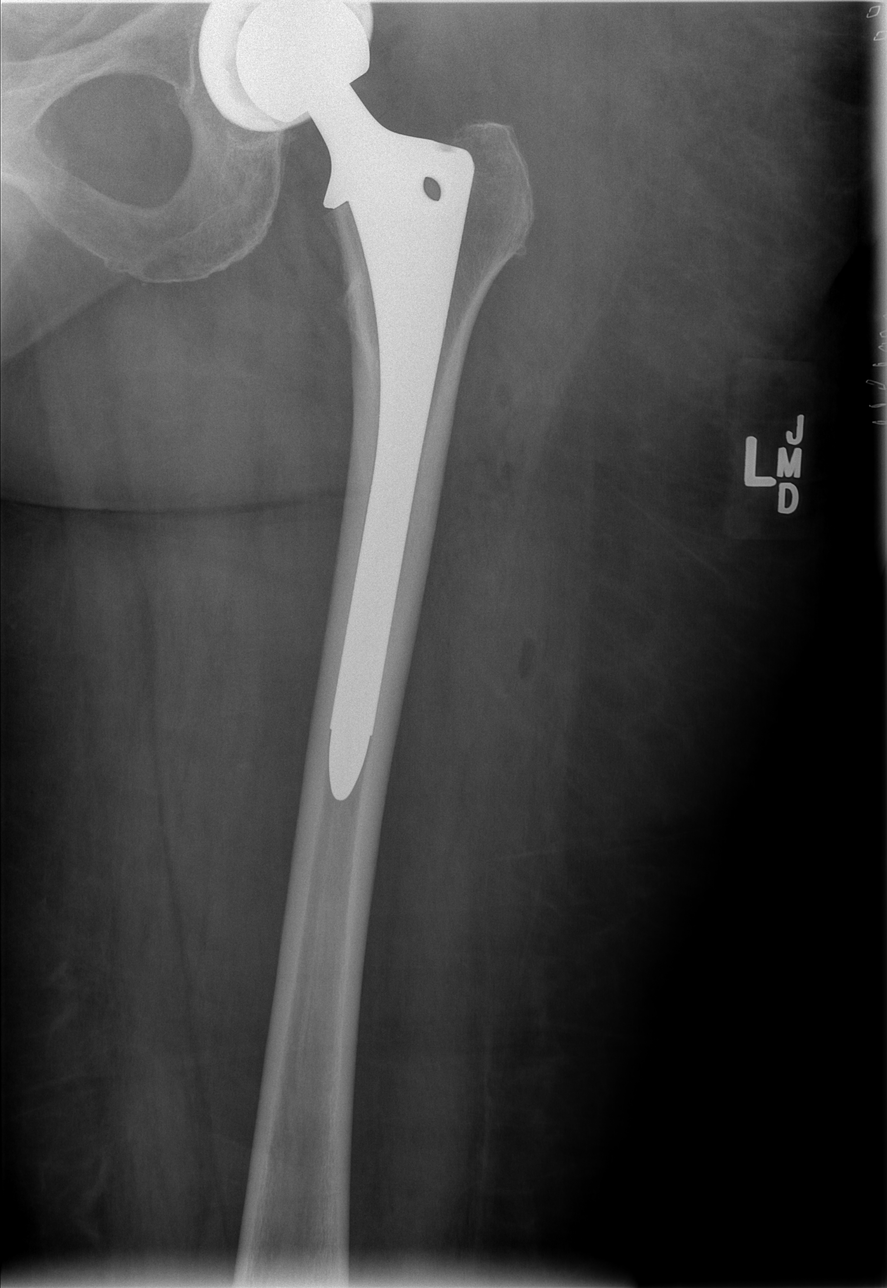

[t hip ap left (2 of 2)]
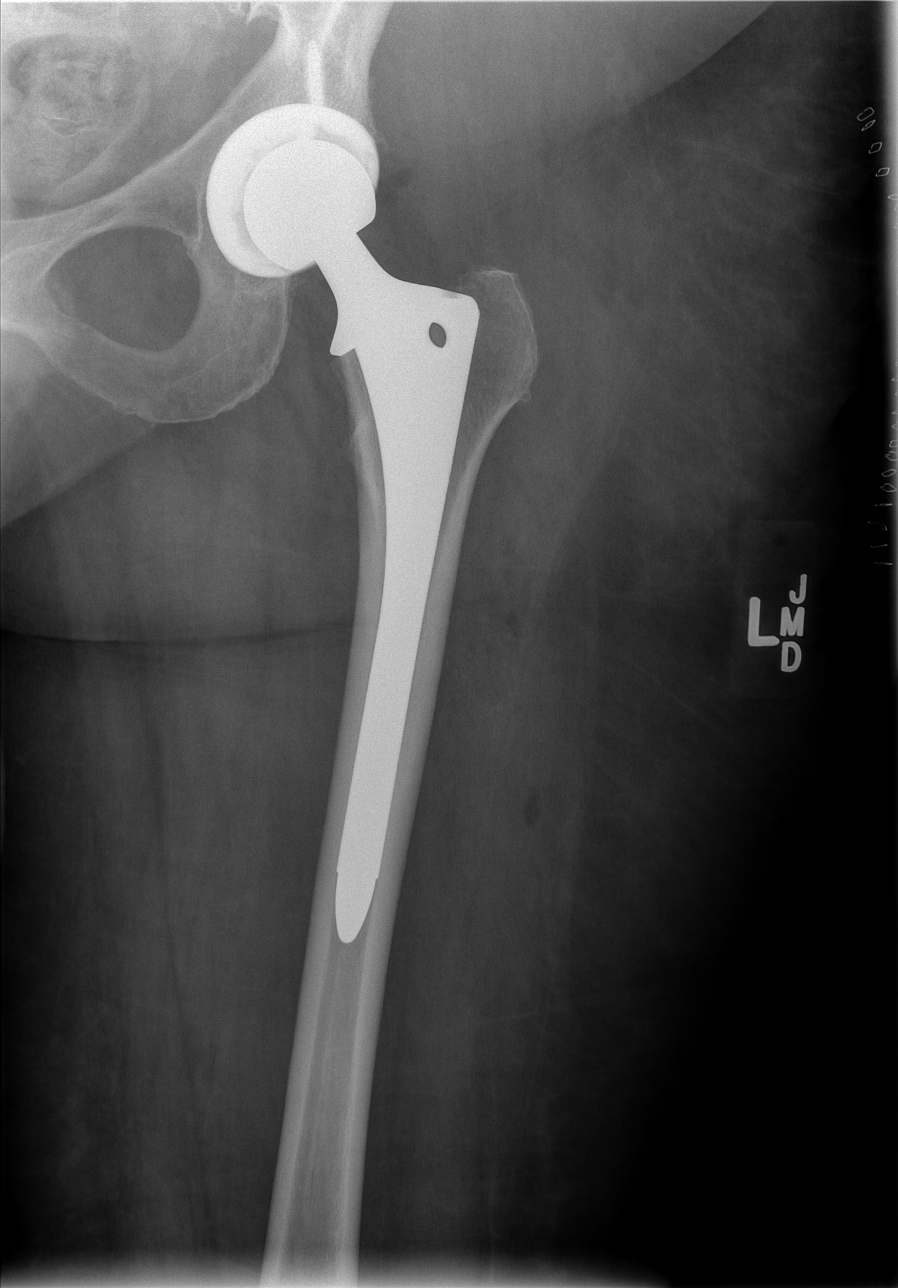

[t hip frog leg left]
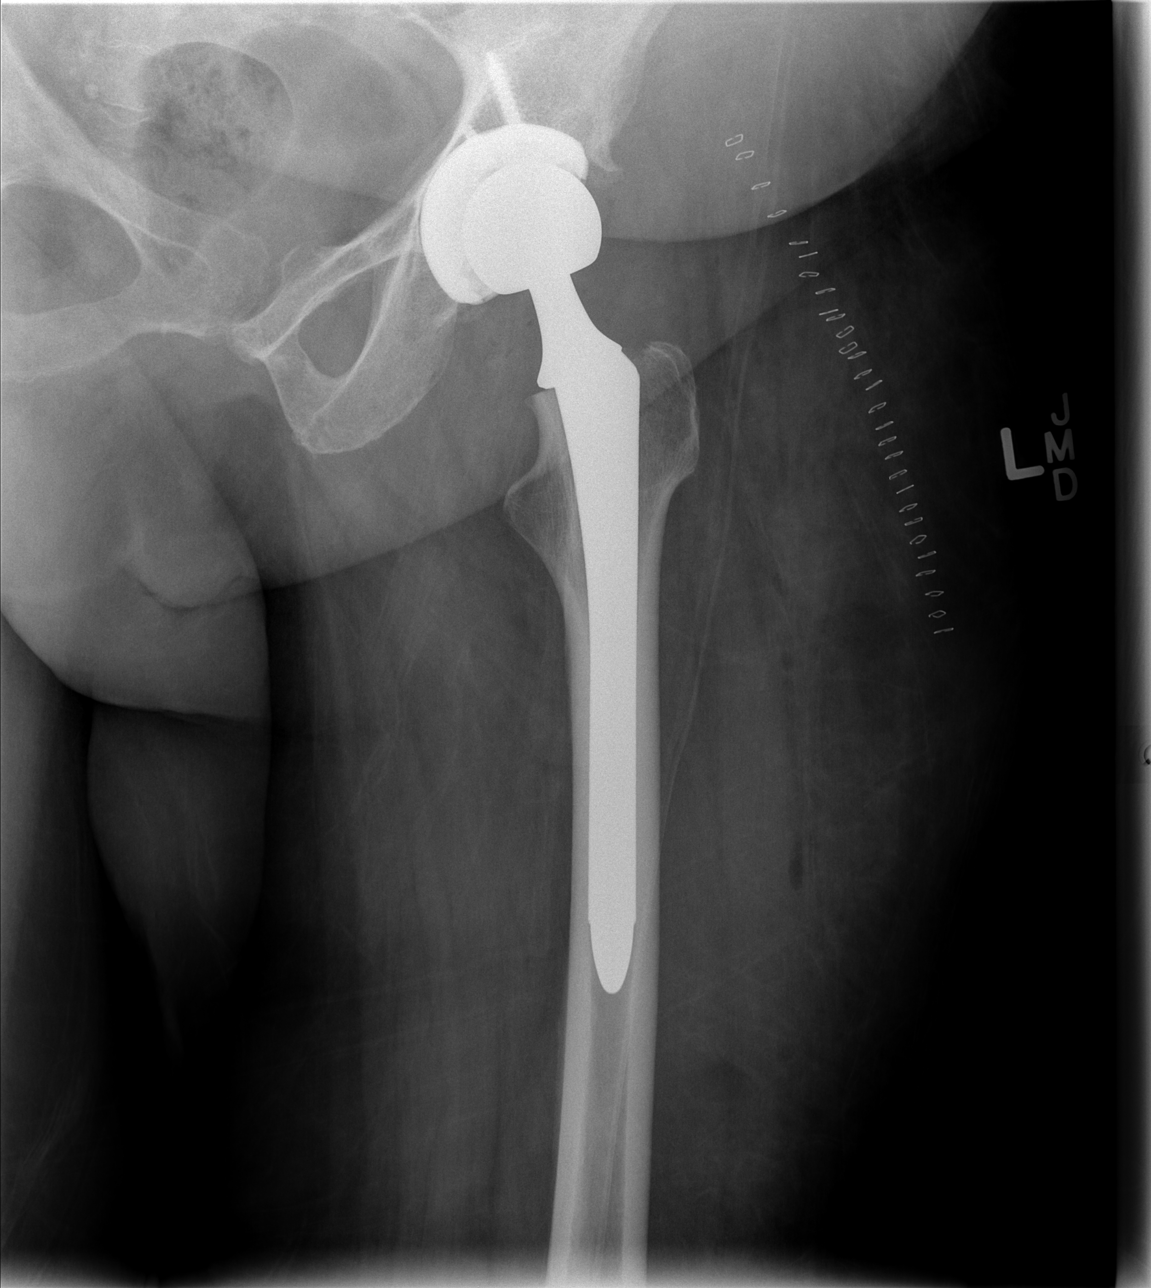

[4 of 4 positions shown; findings below may reference images not displayed]

FINDINGS: There is no evidence of fracture or dislocation.  The
patient's bilateral total hip arthroplasties appear intact, without
evidence of loosening; the right total hip arthroplasty is
incompletely imaged on this study.  The proximal left femur appears
intact.  Mild degenerative change is noted at the lower lumbar
spine.  The sacroiliac joints are unremarkable in appearance.

The visualized bowel gas pattern is grossly unremarkable in
appearance.  Skin staples are noted overlying the left hip joint;
residual soft tissue air is noted.
IMPRESSION: 1.  No evidence of fracture or dislocation.
2.  Bilateral total hip arthroplasties appear intact, without
evidence of loosening.
2.  Residual soft tissue air noted at the site of surgery.

## 2013-07-17 IMAGING — CT CT HEAD W/O CM
2 series · 16 of 30 positions shown, 20 images · non-contrast
Comparison: None.

CLINICAL DATA: Altered mental status.  Out at tarry elucidation
this.  Recent hip surgery.

CT HEAD WITHOUT CONTRAST
TECHNIQUE: Contiguous axial images were obtained from the base of
the skull through the vertex without contrast.

[Series 2: head w/o · axial · non-contrast · 0.43mm/px · z∈[-153,-23]mm · 13 of 32 slices shown, 17 images]
[im 3/32  brain]
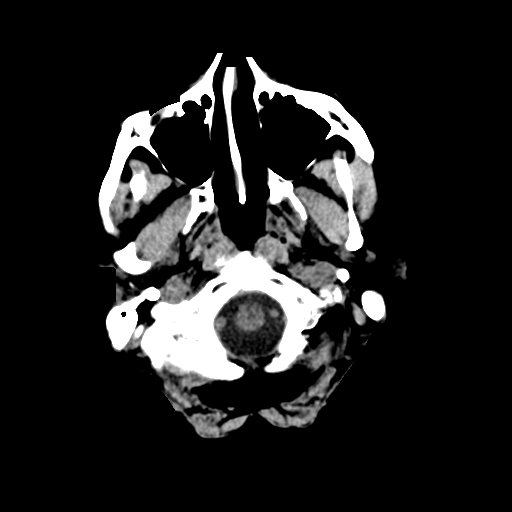
[im 3/32  bone]
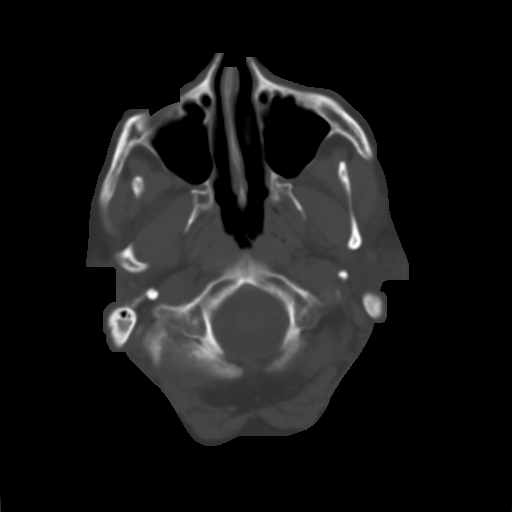
[im 5/32  brain]
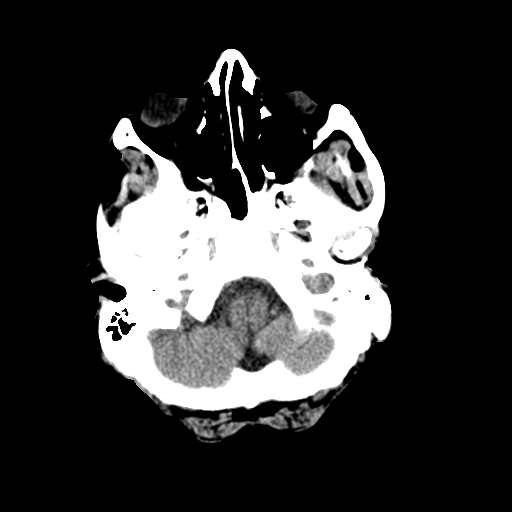
[im 7/32  brain]
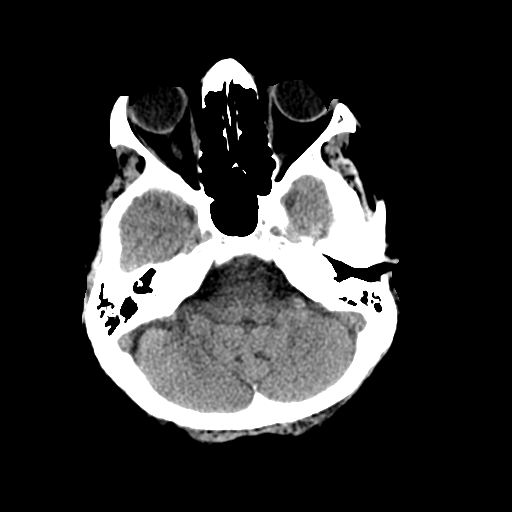
[im 9/32  brain]
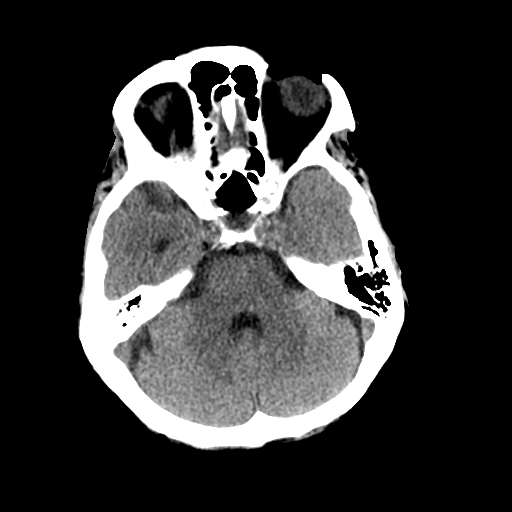
[im 12/32  brain]
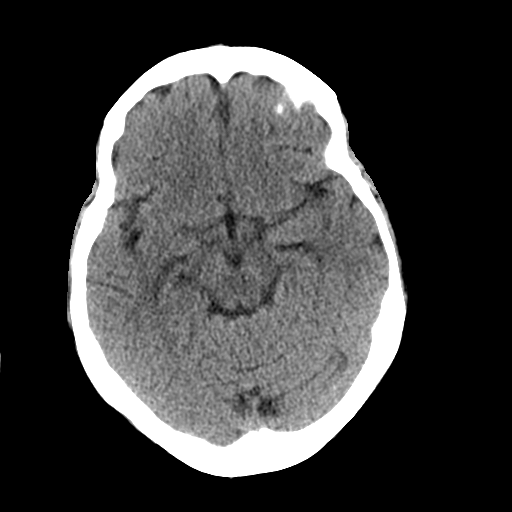
[im 12/32  bone]
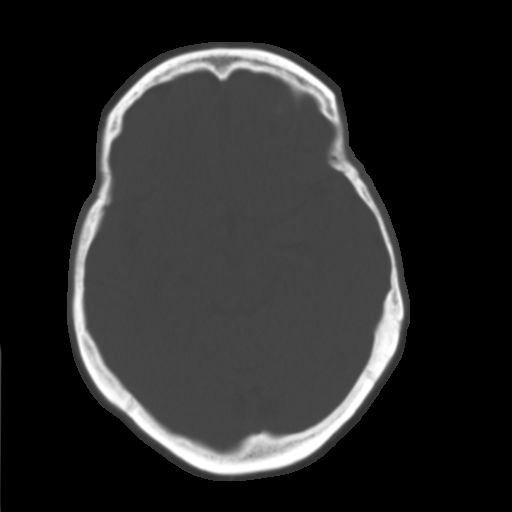
[im 14/32  brain]
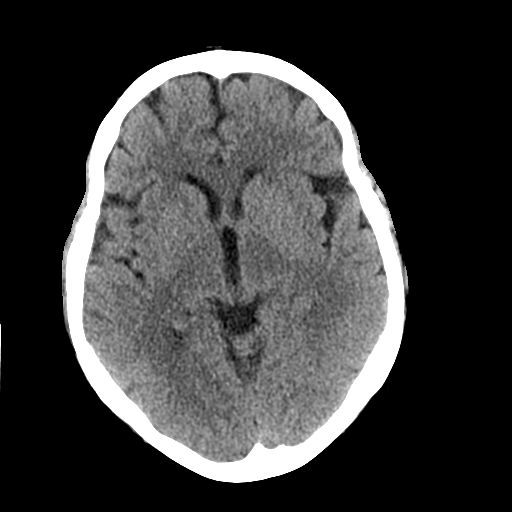
[im 16/32  brain]
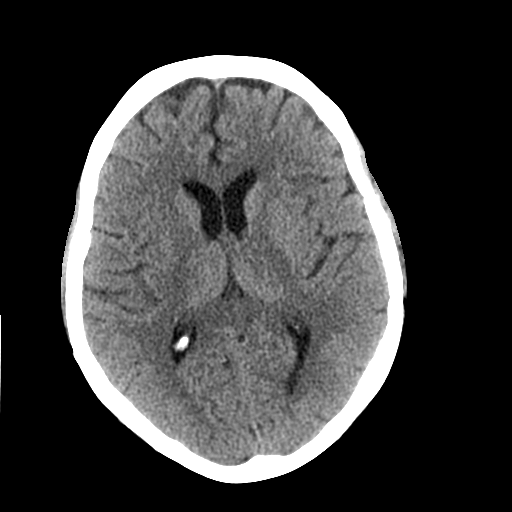
[im 18/32  brain]
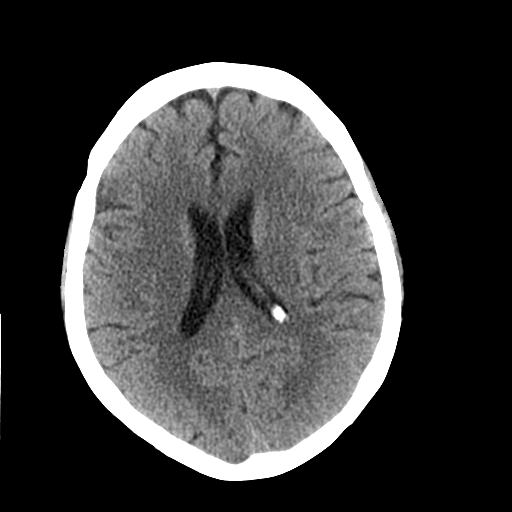
[im 20/32  brain]
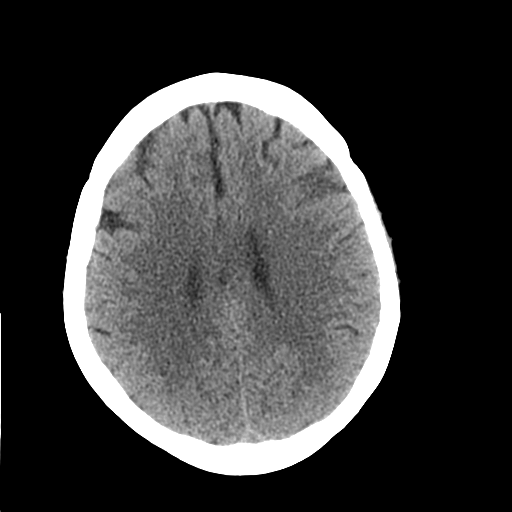
[im 20/32  bone]
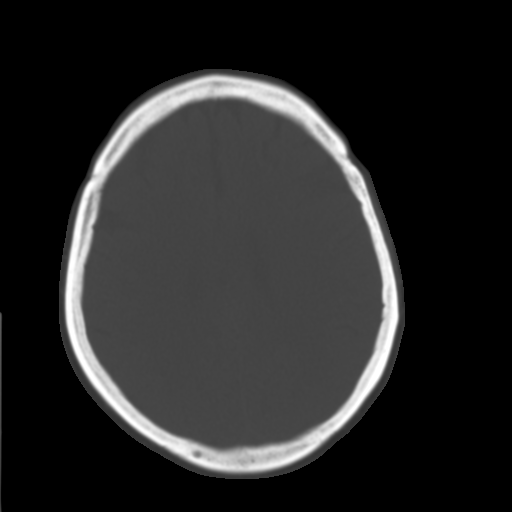
[im 23/32  brain]
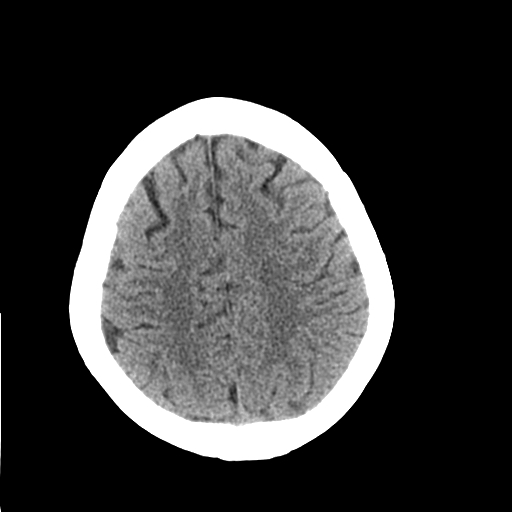
[im 25/32  brain]
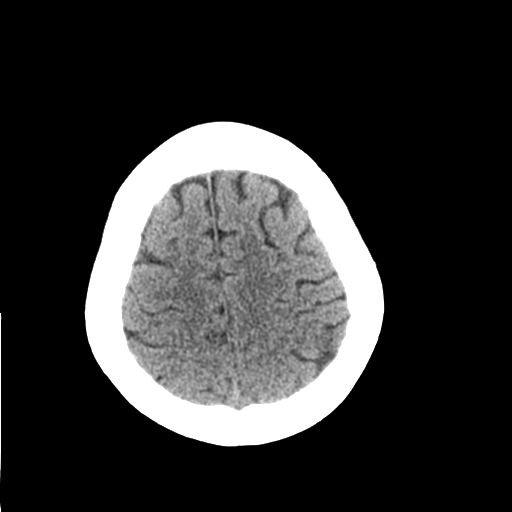
[im 27/32  brain]
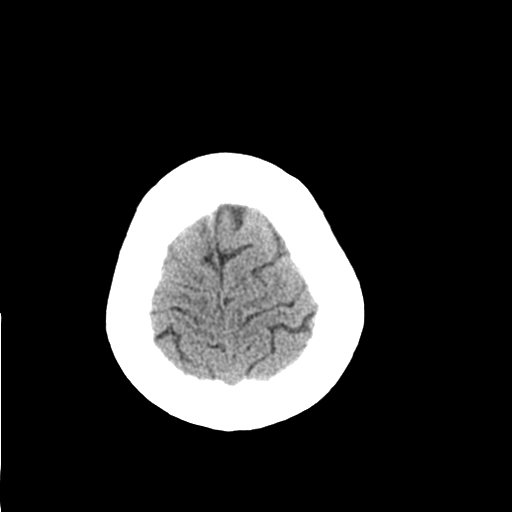
[im 29/32  brain]
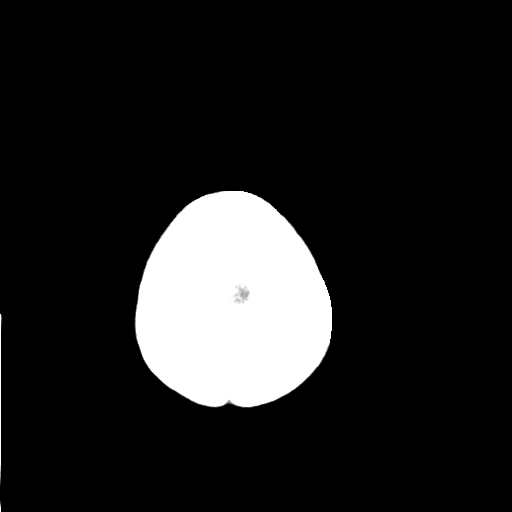
[im 29/32  bone]
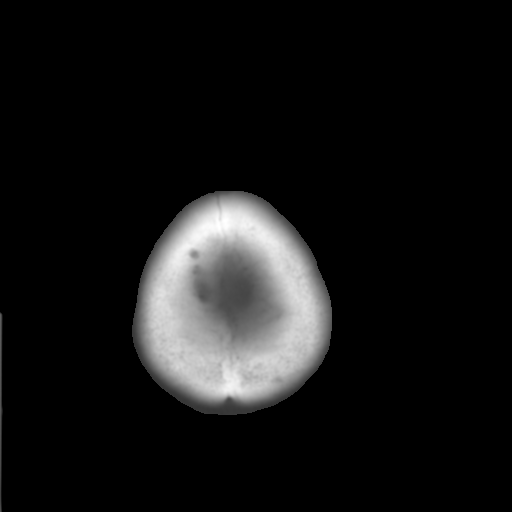

[Series 3: bone windows · axial · 0.43mm/px · z∈[-153,-108]mm · 3 of 32 slices shown]
[im 3/32  bone]
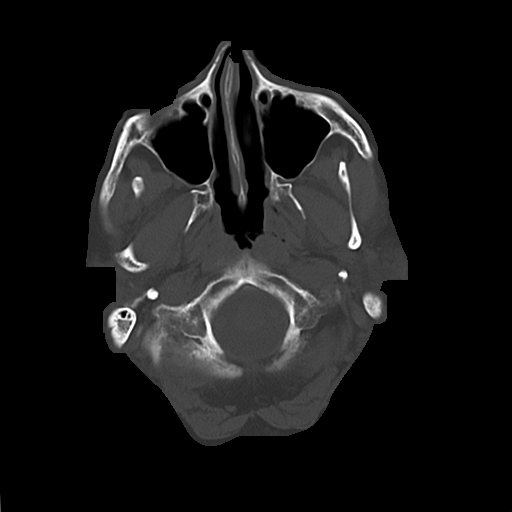
[im 7/32  bone]
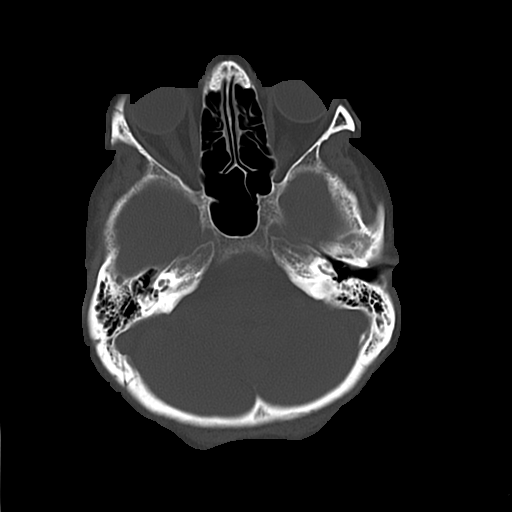
[im 12/32  bone]
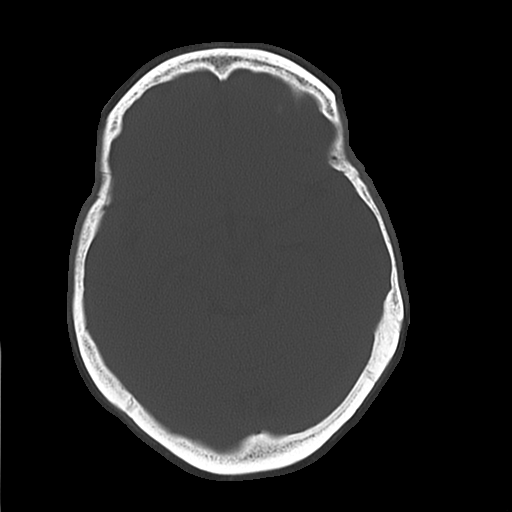

[16 of 30 positions shown; findings below may reference images not displayed]

FINDINGS: No acute intracranial abnormality is present.
Specifically, there is no evidence for acute infarct, hemorrhage,
mass, hydrocephalus, or extra-axial fluid collection.  The
paranasal sinuses and mastoid air cells are clear.  The globes and
orbits are intact.  The osseous skull is intact.
IMPRESSION: Negative CT of the head.

## 2013-09-20 ENCOUNTER — Other Ambulatory Visit: Payer: Self-pay

## 2013-09-20 MED ORDER — POTASSIUM CHLORIDE CRYS ER 20 MEQ PO TBCR: 20.0000 meq | EXTENDED_RELEASE_TABLET | Freq: Two times a day (BID) | ORAL | Status: DC

## 2013-12-17 ENCOUNTER — Other Ambulatory Visit: Payer: Self-pay | Admitting: Cardiology

## 2014-03-21 ENCOUNTER — Other Ambulatory Visit: Payer: Self-pay | Admitting: Cardiology

## 2014-03-25 ENCOUNTER — Encounter: Payer: Self-pay | Admitting: Cardiology

## 2014-05-30 ENCOUNTER — Encounter: Payer: Self-pay | Admitting: Cardiology

## 2014-05-30 ENCOUNTER — Ambulatory Visit (INDEPENDENT_AMBULATORY_CARE_PROVIDER_SITE_OTHER): Payer: Medicare Other | Admitting: Cardiology

## 2014-05-30 VITALS — BP 142/80 | HR 63 | Ht 65.5 in | Wt 204.0 lb

## 2014-05-30 DIAGNOSIS — I4510 Unspecified right bundle-branch block: Secondary | ICD-10-CM

## 2014-05-30 NOTE — Progress Notes (Addendum)
Cardiology Office Note   Date:  05/30/2014   ID:  [name], DOB 04/22/1940, MRN [id number]  PCP:  Irven Shelling, MD    Chief Complaint  Patient presents with  . Hypertension      History of Present Illness: [name] is a 74 y.o. female with a history of HTN who presents today for followup. She is doing well. She denies any chest pain, SOB, DOE, LE edema, dizziness, palpitations or syncope.    Past Medical History  Diagnosis Date  . Seasonal allergies   . Arthritis   . Anxiety   . Depression   . Pneumonia     hx of   . Neuromuscular disorder     numbness in middle toes on left leg   . Chronic kidney disease     hx of uti   . Anemia     hx of years ago   . DJD (degenerative joint disease)   . Syncope and collapse     in the setting of diarrheal illness-Dr Kyria Bumgardner  . Colon polyp   . Lumbar radiculopathy     S/P ESI- Dr Maxie Better Dr. Nelva Bush  . Hypertension   . RBBB     Past Surgical History  Procedure Laterality Date  . Vaginal hysterectomy  1981  . Rectocele repair  1993  . Total hip arthroplasty  2008    right  . Back surgery  2006  . Total hip arthroplasty  12/16/2011    Procedure: TOTAL HIP ARTHROPLASTY;  Surgeon: [name], MD;  Location: [facility];  Service: Orthopedics;  Laterality: Left;  . Cardiac catheterization  2003    Normal coronary arteries     Current Outpatient Prescriptions  Medication Sig Dispense Refill  . Calcium Carbonate-Vitamin D (CALCIUM-D PO) Take 1,200 mg by mouth daily.     . cephALEXin (KEFLEX) 250 MG capsule Take 250 mg by mouth daily.     [name] Desonide Crea-Moisturizing Lot 0.05 % KIT Apply topically. Uses every other day as a preventive for fungus  Under breasts and abdominal area    . ESTRACE VAGINAL 0.1 MG/GM vaginal cream Place 1 Applicatorful vaginally once a week.     . fish oil-omega-3 fatty acids 1000 MG capsule Take by mouth daily.    [name] HYDROcodone-acetaminophen (NORCO/VICODIN) 5-325 MG per tablet  Take 0.5 tablets by mouth every 6 (six) hours as needed for moderate pain.    [name] loratadine (CLARITIN) 10 MG tablet Take 10 mg by mouth daily.    [name] LORazepam (ATIVAN) 1 MG tablet Take 0.5 mg by mouth at bedtime. scheduled    . metoprolol succinate (TOPROL-XL) 50 MG 24 hr tablet TAKE 1&1/2 TABLETS ONCE DAILY. 135 tablet 6  . Multiple Vitamin (MULTIVITAMIN) tablet Take 1 tablet by mouth daily.    [name] PARoxetine (PAXIL) 40 MG tablet Take 40 mg by mouth daily before breakfast.     . potassium chloride SA (K-DUR,KLOR-CON) 20 MEQ tablet TAKE 1 TABLET TWICE DAILY. 180 tablet 0  . tetrahydrozoline-zinc (VISINE-AC) 0.05-0.25 % ophthalmic solution Place 1 drop into both eyes at bedtime as needed. scheduled    . valsartan-hydrochlorothiazide (DIOVAN-HCT) 320-25 MG per tablet TAKE 1 TABLET ONCE DAILY. 90 tablet 1  . vitamin C (ASCORBIC ACID) 500 MG tablet Take 1,000 mg by mouth daily.      No current facility-administered medications for this visit.    Allergies:   Ciprocin-fluocin-procin; Citric acid; Cleocin; Cranberry; Iodine; and Septra  Social History:  The patient  reports that she has never smoked. She has never used smokeless tobacco. She reports that she does not drink alcohol or use illicit drugs.   Family History:  The patient's family history includes Colon cancer (age of onset: 17) in her mother; Heart failure in her father; Lung cancer in her mother; Pancreatic cancer in her father; Skin cancer in her sister. There is no history of Stomach cancer.    ROS:  Please see the history of present illness.   Otherwise, review of systems are positive for none.   All other systems are reviewed and negative.    PHYSICAL EXAM: VS:  BP 142/80 mmHg  Pulse 63  Ht 5' 5.5" (1.664 m)  Wt 204 lb (92.534 kg)  BMI 33.42 kg/m2 , BMI Body mass index is 33.42 kg/(m^2). GEN: Well nourished, well developed, in no acute distress HEENT: normal Neck: no JVD, carotid bruits, or masses Cardiac: RRR; no murmurs,  rubs, or gallops,no edema  Respiratory:  clear to auscultation bilaterally, normal work of breathing GI: soft, nontender, nondistended, + BS MS: no deformity or atrophy Skin: warm and dry, no rash Neuro:  Strength and sensation are intact Psych: euthymic mood, full affect   EKG:  EKG was ordered today NSR with RBBB    Recent Labs: No results found for requested labs within last 365 days.    Lipid Panel No results found for: CHOL, TRIG, HDL, CHOLHDL, VLDL, LDLCALC, LDLDIRECT    Wt Readings from Last 3 Encounters:  05/30/14 204 lb (92.534 kg)  04/30/13 201 lb (91.173 kg)  12/16/11 203 lb 14.8 oz (92.5 kg)     ASSESSMENT AND PLAN:  1. HTN - well controlled - Conntinue Diovan HCT/Metoprolol 2. Chronic RBBB    Current medicines are reviewed at length with the patient today.  The patient does not have concerns regarding medicines.  The following changes have been made:  no change  Labs/ tests ordered today include: see above assessment and plan No orders of the defined types were placed in this encounter.     Disposition:   FU with me in 1 year   Signed, Sueanne Margarita, MD  05/30/2014 2:49 PM    [address], [place], [place]  [address] Phone: [phone]; Fax: [phone]

## 2014-05-30 NOTE — Addendum Note (Signed)
Addended by: [name] R on: 05/30/2014 11:09 PM   Modules accepted: Miquel Dunn

## 2014-05-30 NOTE — Patient Instructions (Signed)
Medication Instructions:  Your physician recommends that you continue on your current medications as directed. Please refer to the Current Medication list given to you today.   Labwork: None  Testing/Procedures: None  Follow-Up: Your physician wants you to follow-up in: 1 year with Dr. Turner. You will receive a reminder letter in the mail two months in advance. If you don't receive a letter, please call our office to schedule the follow-up appointment.  

## 2014-06-19 ENCOUNTER — Other Ambulatory Visit: Payer: Self-pay | Admitting: Cardiology

## 2014-06-19 NOTE — Telephone Encounter (Signed)
Ok to refill? I do not see any recent labs. Please advise. Thanks, MI

## 2014-06-25 ENCOUNTER — Other Ambulatory Visit: Payer: Self-pay

## 2014-06-25 MED ORDER — POTASSIUM CHLORIDE CRYS ER 20 MEQ PO TBCR: 20.0000 meq | EXTENDED_RELEASE_TABLET | Freq: Two times a day (BID) | ORAL | Status: DC

## 2014-06-25 NOTE — Telephone Encounter (Signed)
Patient called for refill on potassium. She stated that her pcp does her lab work, but I do not see anything scanned into her chart. She does not see her pcp again until August. Ok to refill? Please advise. Thanks, MI

## 2014-06-26 ENCOUNTER — Encounter: Payer: Self-pay | Admitting: Cardiology

## 2014-07-24 NOTE — Telephone Encounter (Signed)
Filled 06/25/14

## 2014-09-12 ENCOUNTER — Other Ambulatory Visit: Payer: Self-pay | Admitting: Cardiology

## 2014-09-29 ENCOUNTER — Encounter: Payer: Self-pay | Admitting: Cardiology

## 2014-10-01 ENCOUNTER — Encounter: Payer: Self-pay | Admitting: Cardiology

## 2015-01-15 ENCOUNTER — Encounter: Payer: Self-pay | Admitting: Internal Medicine

## 2015-06-25 ENCOUNTER — Other Ambulatory Visit: Payer: Self-pay | Admitting: Cardiology

## 2015-06-26 ENCOUNTER — Other Ambulatory Visit: Payer: Self-pay | Admitting: *Deleted

## 2015-09-28 ENCOUNTER — Other Ambulatory Visit: Payer: Self-pay | Admitting: Cardiology

## 2015-09-29 ENCOUNTER — Other Ambulatory Visit: Payer: Self-pay | Admitting: *Deleted

## 2015-09-29 MED ORDER — VALSARTAN-HYDROCHLOROTHIAZIDE 320-25 MG PO TABS: 1.0000 | ORAL_TABLET | Freq: Every day | ORAL | 0 refills | Status: DC

## 2015-09-29 MED ORDER — METOPROLOL SUCCINATE ER 50 MG PO TB24: ORAL_TABLET | ORAL | 0 refills | Status: DC

## 2015-10-06 ENCOUNTER — Encounter: Payer: Self-pay | Admitting: Cardiology

## 2015-10-07 ENCOUNTER — Encounter: Payer: Self-pay | Admitting: Cardiology

## 2015-10-07 ENCOUNTER — Encounter (INDEPENDENT_AMBULATORY_CARE_PROVIDER_SITE_OTHER): Payer: Self-pay

## 2015-10-07 ENCOUNTER — Ambulatory Visit (INDEPENDENT_AMBULATORY_CARE_PROVIDER_SITE_OTHER): Payer: Medicare Other | Admitting: Cardiology

## 2015-10-07 VITALS — BP 138/86 | HR 62 | Ht 66.0 in | Wt 190.0 lb

## 2015-10-07 DIAGNOSIS — I10 Essential (primary) hypertension: Secondary | ICD-10-CM

## 2015-10-07 DIAGNOSIS — I4510 Unspecified right bundle-branch block: Secondary | ICD-10-CM | POA: Diagnosis not present

## 2015-10-07 MED ORDER — VALSARTAN-HYDROCHLOROTHIAZIDE 320-25 MG PO TABS: 1.0000 | ORAL_TABLET | Freq: Every day | ORAL | 11 refills | Status: DC

## 2015-10-07 MED ORDER — METOPROLOL SUCCINATE ER 50 MG PO TB24: ORAL_TABLET | ORAL | 11 refills | Status: DC

## 2015-10-07 NOTE — Progress Notes (Signed)
Cardiology Office Note    Date:  10/07/2015   ID:  [name] 1940-11-21, MRN [id number]  PCP:  Irven Shelling, MD  Cardiologist:  [name], MD   Chief Complaint  Patient presents with  . Hypertension    History of Present Illness:  [name] is a 75 y.o. female with a history of HTN who presents today for followup. She is doing well. She denies any chest pain, SOB, DOE, LE edema (except when eating too much salt), dizziness, palpitations or syncope.    Past Medical History:  Diagnosis Date  . Anemia    hx of years ago   . Anxiety   . Arthritis   . Chronic kidney disease    hx of uti   . Colon polyp   . Depression   . DJD (degenerative joint disease)   . Hypertension   . Lumbar radiculopathy    S/P ESI- Dr Maxie Better Dr. Nelva Bush  . Neuromuscular disorder (HCC)    numbness in middle toes on left leg   . Pneumonia    hx of   . RBBB   . Seasonal allergies   . Syncope and collapse    in the setting of diarrheal illness-Dr Raegan Winders    Past Surgical History:  Procedure Laterality Date  . BACK SURGERY  2006  . CARDIAC CATHETERIZATION  2003   Normal coronary arteries  . [employer]  . TOTAL HIP ARTHROPLASTY  2008   right  . TOTAL HIP ARTHROPLASTY  12/16/2011   Procedure: TOTAL HIP ARTHROPLASTY;  Surgeon: [name], MD;  Location: [facility];  Service: Orthopedics;  Laterality: Left;  [name] VAGINAL HYSTERECTOMY  1981    Current Medications: Outpatient Medications Prior to Visit  Medication Sig Dispense Refill  . Calcium Carbonate-Vitamin D (CALCIUM-D PO) Take 1,200 mg by mouth daily.     . cephALEXin (KEFLEX) 250 MG capsule Take 250 mg by mouth daily.     [name] Desonide Crea-Moisturizing Lot 0.05 % KIT Apply topically. Uses every other day as a preventive for fungus  Under breasts and abdominal area    . ESTRACE VAGINAL 0.1 MG/GM vaginal cream Place 1 Applicatorful vaginally once a week.     . fish oil-omega-3 fatty acids 1000 MG capsule  Take by mouth daily.    [name] HYDROcodone-acetaminophen (NORCO/VICODIN) 5-325 MG per tablet Take 0.5 tablets by mouth every 6 (six) hours as needed for moderate pain.    [name] loratadine (CLARITIN) 10 MG tablet Take 10 mg by mouth daily.    [name] LORazepam (ATIVAN) 1 MG tablet Take 0.5 mg by mouth at bedtime. scheduled    . metoprolol succinate (TOPROL-XL) 50 MG 24 hr tablet Patient takes 1.5 tablets by mouth daily 45 tablet 0  . Multiple Vitamin (MULTIVITAMIN) tablet Take 1 tablet by mouth daily.    [name] PARoxetine (PAXIL) 40 MG tablet Take 40 mg by mouth daily before breakfast.     . potassium chloride SA (K-DUR,KLOR-CON) 20 MEQ tablet TAKE 1 TABLET TWICE DAILY. 180 tablet 3  . tetrahydrozoline-zinc (VISINE-AC) 0.05-0.25 % ophthalmic solution Place 1 drop into both eyes at bedtime as needed. scheduled    . valsartan-hydrochlorothiazide (DIOVAN-HCT) 320-25 MG tablet Take 1 tablet by mouth daily. 30 tablet 0  . vitamin C (ASCORBIC ACID) 500 MG tablet Take 1,000 mg by mouth daily.      No facility-administered medications prior to visit.      Allergies:   Ciprocin-fluocin-procin [fluocinolone]; Citric  acid; Cleocin [clindamycin hcl]; Cranberry; Iodine; and Septra [sulfamethoxazole-trimethoprim]   Social History   Social History  . Marital status: Widowed    Spouse name: N/A  . Number of children: N/A  . Years of education: N/A   Occupational History  . Retired Education officer, museum    Social History Main Topics  . Smoking status: Never Smoker  . Smokeless tobacco: Never Used  . Alcohol use No  . Drug use: No  . Sexual activity: Not Asked   Other Topics Concern  . None   Social History Narrative   Widowed.  Lived with a companion prior to going to [facility] for rehab.     Family History:  The patient's family history includes Colon cancer (age of onset: 33) in her mother; Heart failure in her father; Lung cancer in her mother; Pancreatic cancer in her father; Skin cancer in her sister.   ROS:     Please see the history of present illness.    Review of Systems  Musculoskeletal: Positive for back pain and joint swelling.  Psychiatric/Behavioral: Positive for depression.   All other systems reviewed and are negative.   PHYSICAL EXAM:   VS:  BP 138/86   Pulse 62   Ht 5' 6" (1.676 m)   Wt 190 lb (86.2 kg)   BMI 30.67 kg/m    GEN: Well nourished, well developed, in no acute distress  HEENT: normal  Neck: no JVD, carotid bruits, or masses Cardiac: RRR; no murmurs, rubs, or gallops,no edema.  Intact distal pulses bilaterally.  Respiratory:  clear to auscultation bilaterally, normal work of breathing GI: soft, nontender, nondistended, + BS MS: no deformity or atrophy  Skin: warm and dry, no rash Neuro:  Alert and Oriented x 3, Strength and sensation are intact Psych: euthymic mood, full affect  Wt Readings from Last 3 Encounters:  10/07/15 190 lb (86.2 kg)  05/30/14 204 lb (92.5 kg)  04/30/13 201 lb (91.2 kg)      Studies/Labs Reviewed:   EKG:  EKG is ordered today.  The ekg ordered today demonstrates NSR with RBBB with inferolateral T wave abnormality  Recent Labs: No results found for requested labs within last 8760 hours.   Lipid Panel No results found for: CHOL, TRIG, HDL, CHOLHDL, VLDL, LDLCALC, LDLDIRECT  Additional studies/ records that were reviewed today include:  none    ASSESSMENT:    1. Essential hypertension   2. RBBB      PLAN:  In order of problems listed above:  1. HTN - BP controlled on currently on current meds.  Continue Toprol/ARB and diuretic. 2. RBBB - no change in EKG    Medication Adjustments/Labs and Tests Ordered: Current medicines are reviewed at length with the patient today.  Concerns regarding medicines are outlined above.  Medication changes, Labs and Tests ordered today are listed in the Patient Instructions below.  There are no Patient Instructions on file for this visit.   Signed, [name], MD  10/07/2015 2:07  PM    [address], [place], [place]  [address] Phone: [phone]; Fax: [phone]

## 2015-10-07 NOTE — Patient Instructions (Signed)

## 2015-10-22 ENCOUNTER — Other Ambulatory Visit: Payer: Self-pay | Admitting: Cardiology

## 2015-10-29 ENCOUNTER — Encounter: Payer: Self-pay | Admitting: Cardiology

## 2015-12-09 NOTE — Progress Notes (Signed)
[facility] [employer] PREP Weekly Session   Patient Details  Name: [name] MRN: [id number] Date of Birth: 1940/03/16 Age: 75 y.o. PCP: Irven Shelling, MD  Vitals:   12/09/15 1339  Weight: 193 lb (87.5 kg)        [facility] [employer] Weekly seesion - 12/09/15 1300      Weekly Session   Topic Discussed Other  fat and calorie detective   Minutes exercised this week 280 minutes  190 cardio/ 90 strength   Classes attended to date [place] 12/09/2015, 1:40 PM

## 2015-12-29 ENCOUNTER — Other Ambulatory Visit: Payer: Self-pay | Admitting: Cardiology

## 2016-01-01 ENCOUNTER — Telehealth: Payer: Self-pay

## 2016-01-26 ENCOUNTER — Other Ambulatory Visit: Payer: Self-pay | Admitting: Cardiology

## 2016-06-20 ENCOUNTER — Encounter: Payer: Self-pay | Admitting: Gastroenterology

## 2016-09-06 ENCOUNTER — Other Ambulatory Visit: Payer: Self-pay | Admitting: Pharmacist

## 2016-09-06 MED ORDER — OLMESARTAN MEDOXOMIL-HCTZ 40-25 MG PO TABS: 1.0000 | ORAL_TABLET | Freq: Every day | ORAL | 1 refills | Status: DC

## 2016-09-06 NOTE — Telephone Encounter (Signed)
Called pt made aware of medication substitution/changes.  Rx sent to pharmacy.

## 2016-09-06 NOTE — Telephone Encounter (Signed)
Received fax from pharmacy that need alternative for valsartan due to recall.

## 2016-09-22 ENCOUNTER — Other Ambulatory Visit: Payer: Self-pay | Admitting: Cardiology

## 2016-10-24 ENCOUNTER — Other Ambulatory Visit: Payer: Self-pay | Admitting: Cardiology

## 2016-11-07 ENCOUNTER — Ambulatory Visit (INDEPENDENT_AMBULATORY_CARE_PROVIDER_SITE_OTHER): Payer: Medicare Other | Admitting: Cardiology

## 2016-11-07 ENCOUNTER — Encounter: Payer: Self-pay | Admitting: Cardiology

## 2016-11-07 VITALS — BP 112/82 | HR 65 | Ht 65.5 in | Wt 197.0 lb

## 2016-11-07 DIAGNOSIS — I4510 Unspecified right bundle-branch block: Secondary | ICD-10-CM

## 2016-11-07 DIAGNOSIS — I10 Essential (primary) hypertension: Secondary | ICD-10-CM | POA: Diagnosis not present

## 2016-11-07 NOTE — Progress Notes (Signed)
Cardiology Office Note:    Date:  11/07/2016   ID:  [name], DOB 09/08/1940, MRN [id number]  PCP:  [name], MD  Cardiologist:  [name], MD   Referring MD: [name], MD   Chief Complaint  Patient presents with  . Hypertension    History of Present Illness:    [name] is a 76 y.o. female with a hx of HTN and RBBB.  She is here today for followup and is doing well.  She denies any chest pain or pressure, SOB, DOE, PND, orthopnea, LE edema, dizziness, palpitations or syncope.    Past Medical History:  Diagnosis Date  . Anemia    hx of years ago   . Anxiety   . Arthritis   . Chronic kidney disease    hx of uti   . Colon polyp   . Depression   . DJD (degenerative joint disease)   . Hypertension   . Lumbar radiculopathy    S/P ESI- Dr Maxie Better Dr. Nelva Bush  . Neuromuscular disorder (HCC)    numbness in middle toes on left leg   . Pneumonia    hx of   . RBBB   . Seasonal allergies   . Syncope and collapse    in the setting of diarrheal illness-Dr turner    Past Surgical History:  Procedure Laterality Date  . BACK SURGERY  2006  . CARDIAC CATHETERIZATION  2003   Normal coronary arteries  . [place]  . TOTAL HIP ARTHROPLASTY  2008   right  . TOTAL HIP ARTHROPLASTY  12/16/2011   Procedure: TOTAL HIP ARTHROPLASTY;  Surgeon: [name], MD;  Location: [facility];  Service: Orthopedics;  Laterality: Left;  [name] VAGINAL HYSTERECTOMY  1981    Current Medications: Current Meds  Medication Sig  . Calcium Carbonate-Vitamin D (CALCIUM-D PO) Take 1,200 mg by mouth daily.   . cephALEXin (KEFLEX) 250 MG capsule Take 250 mg by mouth daily.   [name] Desonide Crea-Moisturizing Lot 0.05 % KIT Apply 1 Dose topically as directed. Uses every other day as a preventive for fungus  Under breasts and abdominal area   . ESTRACE VAGINAL 0.1 MG/GM vaginal cream Place 1 Applicatorful vaginally once a week.   . fish oil-omega-3 fatty acids 1000 MG capsule Take  1 g by mouth daily.   [name] HYDROcodone-acetaminophen (NORCO/VICODIN) 5-325 MG per tablet Take 0.5 tablets by mouth every 6 (six) hours as needed for moderate pain.  [name] loratadine (CLARITIN) 10 MG tablet Take 10 mg by mouth daily.  [name] LORazepam (ATIVAN) 1 MG tablet Take 0.5 mg by mouth at bedtime. scheduled  . metoprolol succinate (TOPROL-XL) 50 MG 24 hr tablet Take 75 mg by mouth daily. Take with or immediately following a meal.  . Multiple Vitamin (MULTIVITAMIN) tablet Take 1 tablet by mouth daily.  [name] olmesartan-hydrochlorothiazide (BENICAR HCT) 40-25 MG tablet Take 1 tablet by mouth daily.  [name] PARoxetine (PAXIL) 40 MG tablet Take 40 mg by mouth daily before breakfast.   . potassium chloride SA (K-DUR,KLOR-CON) 20 MEQ tablet Take 20 mEq by mouth 2 (two) times daily.  [name] tetrahydrozoline-zinc (VISINE-AC) 0.05-0.25 % ophthalmic solution Place 1 drop into both eyes at bedtime as needed (eyes). scheduled  . vitamin C (ASCORBIC ACID) 500 MG tablet Take 1,000 mg by mouth daily.      Allergies:   Ciprocin-fluocin-procin [fluocinolone]; Citric acid; Cleocin [clindamycin hcl]; Cranberry; Iodine; and Septra [sulfamethoxazole-trimethoprim]   Social History   Social  History  . Marital status: Widowed    Spouse name: N/A  . Number of children: N/A  . Years of education: N/A   Occupational History  . Retired Education officer, museum    Social History Main Topics  . Smoking status: Never Smoker  . Smokeless tobacco: Never Used  . Alcohol use No  . Drug use: No  . Sexual activity: Not Asked   Other Topics Concern  . None   Social History Narrative   Widowed.  Lived with a companion prior to going to [facility] for rehab.     Family History: The patient's family history includes Colon cancer (age of onset: 34) in her mother; Heart failure in her father; Lung cancer in her mother; Pancreatic cancer in her father; Skin cancer in her sister. There is no history of Stomach cancer.  ROS:   Please see the  history of present illness.    ROS  All other systems reviewed and negative.   EKGs/Labs/Other Studies Reviewed:    The following studies were reviewed today: none  EKG:  EKG is  ordered today.  The ekg ordered today demonstrates NSR at 62bpm with RBBB  Recent Labs: No results found for requested labs within last 8760 hours.   Recent Lipid Panel No results found for: CHOL, TRIG, HDL, CHOLHDL, VLDL, LDLCALC, LDLDIRECT  Physical Exam:    VS:  BP 112/82   Pulse 65   Ht 5' 5.5" (1.664 m)   Wt 197 lb (89.4 kg)   SpO2 97%   BMI 32.28 kg/m     Wt Readings from Last 3 Encounters:  11/07/16 197 lb (89.4 kg)  12/09/15 193 lb (87.5 kg)  10/07/15 190 lb (86.2 kg)     GEN:  Well nourished, well developed in no acute distress HEENT: Normal NECK: No JVD; No carotid bruits LYMPHATICS: No lymphadenopathy CARDIAC: RRR, no murmurs, rubs, gallops RESPIRATORY:  Clear to auscultation without rales, wheezing or rhonchi  ABDOMEN: Soft, non-tender, non-distended MUSCULOSKELETAL:  No edema; No deformity  SKIN: Warm and dry NEUROLOGIC:  Alert and oriented x 3 PSYCHIATRIC:  Normal affect   ASSESSMENT:    1. Essential hypertension   2. RBBB    PLAN:    In order of problems listed above:  1.  HTN- her BP is well controlled on exam today.   She will continue on Toprol XL 76m daily and Benicar HCT 40-274mdaily.    2.  RBBB - this is chronic.   Medication Adjustments/Labs and Tests Ordered: Current medicines are reviewed at length with the patient today.  Concerns regarding medicines are outlined above.  No orders of the defined types were placed in this encounter.  No orders of the defined types were placed in this encounter.   Signed, TrFransico HimMD  11/07/2016 4:03 PM    [facility]

## 2016-11-07 NOTE — Patient Instructions (Signed)
Medication Instructions:  Your physician recommends that you continue on your current medications as directed. Please refer to the Current Medication list given to you today.   Labwork: BMET today  Testing/Procedures: None ordered  Follow-Up: Your physician wants you to follow-up in: 1 year with Dr. [name]. You will receive a reminder letter in the mail two months in advance. If you don't receive a letter, please call our office to schedule the follow-up appointment.   Any Other Special Instructions Will Be Listed Below (If Applicable).     If you need a refill on your cardiac medications before your next appointment, please call your pharmacy.

## 2016-11-08 ENCOUNTER — Telehealth: Payer: Self-pay | Admitting: Cardiology

## 2016-11-08 LAB — BASIC METABOLIC PANEL
BUN/Creatinine Ratio: 17 (ref 12–28)
BUN: 13 mg/dL (ref 8–27)
CALCIUM: 9.3 mg/dL (ref 8.7–10.3)
CO2: 23 mmol/L (ref 20–29)
CREATININE: 0.75 mg/dL (ref 0.57–1.00)
Chloride: 99 mmol/L (ref 96–106)
GFR, EST AFRICAN AMERICAN: 90 mL/min/{1.73_m2} (ref >59)
GFR, EST NON AFRICAN AMERICAN: 78 mL/min/{1.73_m2} (ref >59)
Glucose: 86 mg/dL (ref 65–99)
Potassium: 4.6 mmol/L (ref 3.5–5.2)
SODIUM: 137 mmol/L (ref 134–144)

## 2016-11-08 NOTE — Telephone Encounter (Signed)
°  New Message ° ° pt verbalized that she is calling for rn  °

## 2016-11-08 NOTE — Telephone Encounter (Signed)
Pt calling about lab done 11/07/16, results discussed with patient.

## 2016-12-03 ENCOUNTER — Other Ambulatory Visit: Payer: Self-pay | Admitting: Cardiology

## 2016-12-20 ENCOUNTER — Encounter: Payer: Self-pay | Admitting: Gastroenterology

## 2017-02-10 ENCOUNTER — Ambulatory Visit (AMBULATORY_SURGERY_CENTER): Payer: Self-pay | Admitting: *Deleted

## 2017-02-10 ENCOUNTER — Other Ambulatory Visit: Payer: Self-pay

## 2017-02-10 ENCOUNTER — Telehealth: Payer: Self-pay | Admitting: Gastroenterology

## 2017-02-10 VITALS — Ht 65.5 in | Wt 200.0 lb

## 2017-02-10 DIAGNOSIS — Z800 Family history of malignant neoplasm of digestive organs: Secondary | ICD-10-CM

## 2017-02-10 MED ORDER — PEG-KCL-NACL-NASULF-NA ASC-C 140 G PO SOLR: 1.0000 | ORAL | 0 refills | Status: DC

## 2017-02-10 NOTE — Telephone Encounter (Signed)
Pt's last colon she had movi prep--  I called pt and asked her how she did with last prep - she said she tolerated it well as far as she remembers- it does not contain citrus-  Will do movi prep instructions and if pt finds contains citrus call and we will ask MD what to use --pt  Will pick up movi prep sample and new instructions    Lot [id number]  Exp 01/2020 as directed   [name] Pv

## 2017-02-10 NOTE — Progress Notes (Signed)
No egg or soy allergy known to patient  No issues with past sedation with any surgeries  or procedures, no intubation problems  No diet pills per patient No home 02 use per patient  No blood thinners per patient  Pt denies issues with constipation  No A fib or A flutter  EMMI video sent to pt's e mail pt declined   

## 2017-02-10 NOTE — Telephone Encounter (Signed)
Pt has severe allergy to citrus - she cannot have plenvu or suprep or gatorade with miralax-  She cannot have anything that contains citrus or lemon flavoring as she swells and and has hives - she cant do flavoring in any water or fluids   Will do miralax mixed in water and dulcolax tablets- discussed with pt- will redo instructions and mail to pt- she is aware to call if she finds that either contain the citrus for changes   Lelan Pons PV

## 2017-02-10 NOTE — Telephone Encounter (Signed)
Patient states she is allergic to citric acid and both plenvu and suprep have that in them. Discussed with patient that nurse would call her back after speaking with the doctor.

## 2017-02-23 ENCOUNTER — Encounter: Payer: Self-pay | Admitting: Gastroenterology

## 2017-02-23 ENCOUNTER — Ambulatory Visit (AMBULATORY_SURGERY_CENTER): Payer: Medicare Other | Admitting: Gastroenterology

## 2017-02-23 VITALS — BP 179/67 | HR 62 | Temp 96.4°F | Resp 13 | Ht 65.0 in | Wt 200.0 lb

## 2017-02-23 DIAGNOSIS — Z1212 Encounter for screening for malignant neoplasm of rectum: Secondary | ICD-10-CM | POA: Diagnosis not present

## 2017-02-23 DIAGNOSIS — Z800 Family history of malignant neoplasm of digestive organs: Secondary | ICD-10-CM

## 2017-02-23 DIAGNOSIS — Z1211 Encounter for screening for malignant neoplasm of colon: Secondary | ICD-10-CM | POA: Diagnosis not present

## 2017-02-23 DIAGNOSIS — D124 Benign neoplasm of descending colon: Secondary | ICD-10-CM

## 2017-02-23 MED ORDER — SODIUM CHLORIDE 0.9 % IV SOLN: 500.0000 mL | Freq: Once | INTRAVENOUS | Status: AC

## 2017-02-23 NOTE — Progress Notes (Signed)
Report given to PACU, vss 

## 2017-02-23 NOTE — Op Note (Signed)
[place] Patient Name: [name] Procedure Date: 02/23/2017 10:22 AM MRN: [id number] Endoscopist: Mauri Pole , MD Age: 77 Referring MD:  Date of Birth: 03-Mar-1940 Gender: Female Account #: [id number] Procedure:                Colonoscopy Indications:              Screening in patient at increased risk: Colorectal                            cancer in mother 62 or older Medicines:                Monitored Anesthesia Care Procedure:                Pre-Anesthesia Assessment:                           - Prior to the procedure, a History and Physical                            was performed, and patient medications and                            allergies were reviewed. The patient's tolerance of                            previous anesthesia was also reviewed. The risks                            and benefits of the procedure and the sedation                            options and risks were discussed with the patient.                            All questions were answered, and informed consent                            was obtained. Prior Anticoagulants: The patient has                            taken no previous anticoagulant or antiplatelet                            agents. ASA Grade Assessment: II - A patient with                            mild systemic disease. After reviewing the risks                            and benefits, the patient was deemed in                            satisfactory condition to undergo the procedure.  After obtaining informed consent, the colonoscope                            was passed under direct vision. Throughout the                            procedure, the patient's blood pressure, pulse, and                            oxygen saturations were monitored continuously. The                            Colonoscope was introduced through the anus and                            advanced to the the  cecum, identified by                            appendiceal orifice and ileocecal valve. The                            colonoscopy was performed without difficulty. The                            patient tolerated the procedure well. The quality                            of the bowel preparation was adequate. The                            ileocecal valve, appendiceal orifice, and rectum                            were photographed. Scope In: 10:34:06 AM Scope Out: 10:54:17 AM Scope Withdrawal Time: 0 hours 13 minutes 30 seconds  Total Procedure Duration: 0 hours 20 minutes 11 seconds  Findings:                 The perianal and digital rectal examinations were                            normal.                           A 4 mm polyp was found in the descending colon. The                            polyp was sessile. The polyp was removed with a                            cold snare. Resection and retrieval were complete.                           Scattered small and large-mouthed diverticula were  found in the sigmoid colon, descending colon,                            transverse colon and ascending colon. There was                            evidence of an impacted diverticulum. There was no                            evidence of diverticular bleeding.                           Non-bleeding internal hemorrhoids were found during                            retroflexion. The hemorrhoids were medium-sized.                           The exam was otherwise without abnormality. Complications:            No immediate complications. Estimated Blood Loss:     Estimated blood loss was minimal. Impression:               - One 4 mm polyp in the descending colon, removed                            with a cold snare. Resected and retrieved.                           - Moderate diverticulosis in the sigmoid colon, in                            the descending colon, in the  transverse colon and                            in the ascending colon. There was evidence of an                            impacted diverticulum. There was no evidence of                            diverticular bleeding.                           - Non-bleeding internal hemorrhoids.                           - The examination was otherwise normal. Recommendation:           - Patient has a contact number available for                            emergencies. The signs and symptoms of potential  delayed complications were discussed with the                            patient. Return to normal activities tomorrow.                            Written discharge instructions were provided to the                            patient.                           - Resume previous diet.                           - Continue present medications.                           - Await pathology results.                           - Repeat colonoscopy date to be determined after                            pending pathology results are reviewed for                            surveillance based on pathology results.                           - Return to GI clinic PRN for hemorrhoidal band                            ligation Mauri Pole, MD 02/23/2017 11:06:29 AM This report has been signed electronically.

## 2017-02-23 NOTE — Patient Instructions (Signed)
Information on diverticulosis, polyps, and hemorrhoids given.   YOU HAD AN ENDOSCOPIC PROCEDURE TODAY AT [facility]:   Refer to the procedure report that was given to you for any specific questions about what was found during the examination.  If the procedure report does not answer your questions, please call your gastroenterologist to clarify.  If you requested that your care partner not be given the details of your procedure findings, then the procedure report has been included in a sealed envelope for you to review at your convenience later.  YOU SHOULD EXPECT: Some feelings of bloating in the abdomen. Passage of more gas than usual.  Walking can help get rid of the air that was put into your GI tract during the procedure and reduce the bloating. If you had a lower endoscopy (such as a colonoscopy or flexible sigmoidoscopy) you may notice spotting of blood in your stool or on the toilet paper. If you underwent a bowel prep for your procedure, you may not have a normal bowel movement for a few days.  Please Note:  You might notice some irritation and congestion in your nose or some drainage.  This is from the oxygen used during your procedure.  There is no need for concern and it should clear up in a day or so.  SYMPTOMS TO REPORT IMMEDIATELY:   Following lower endoscopy (colonoscopy or flexible sigmoidoscopy):  Excessive amounts of blood in the stool  Significant tenderness or worsening of abdominal pains  Swelling of the abdomen that is new, acute  Fever of 100F or higher  For urgent or emergent issues, a gastroenterologist can be reached at any hour by calling [phone].   DIET:  We do recommend a small meal at first, but then you may proceed to your regular diet.  Drink plenty of fluids but you should avoid alcoholic beverages for 24 hours.  ACTIVITY:  You should plan to take it easy for the rest of today and you should NOT DRIVE or use heavy machinery until  tomorrow (because of the sedation medicines used during the test).    FOLLOW UP: Our staff will call the number listed on your records the next business day following your procedure to check on you and address any questions or concerns that you may have regarding the information given to you following your procedure. If we do not reach you, we will leave a message.  However, if you are feeling well and you are not experiencing any problems, there is no need to return our call.  We will assume that you have returned to your regular daily activities without incident.  If any biopsies were taken you will be contacted by phone or by letter within the next 1-3 weeks.  Please call us at [phone] if you have not heard about the biopsies in 3 weeks.    SIGNATURES/CONFIDENTIALITY: You and/or your care partner have signed paperwork which will be entered into your electronic medical record.  These signatures attest to the fact that that the information above on your After Visit Summary has been reviewed and is understood.  Full responsibility of the confidentiality of this discharge information lies with you and/or your care-partner.

## 2017-02-23 NOTE — Progress Notes (Signed)
Pt's states no medical or surgical changes since previsit or office visit. 

## 2017-02-23 NOTE — Progress Notes (Signed)
Called to room to assist during endoscopic procedure.  Patient ID and intended procedure confirmed with present staff. Received instructions for my participation in the procedure from the performing physician.  

## 2017-02-24 ENCOUNTER — Telehealth: Payer: Self-pay | Admitting: *Deleted

## 2017-02-24 NOTE — Telephone Encounter (Signed)
  Follow up Call-  Call back number 02/23/2017  Post procedure Call Back phone  # [phone]  Permission to leave phone message Yes  Some recent data might be hidden     Patient questions:  Do you have a fever, pain , or abdominal swelling? No. Pain Score  0 *  Have you tolerated food without any problems? Yes.    Have you been able to return to your normal activities? Yes.    Do you have any questions about your discharge instructions: Diet   No. Medications  No. Follow up visit  No.  Do you have questions or concerns about your Care? No.  Actions: * If pain score is 4 or above: No action needed, pain <4.

## 2017-03-01 ENCOUNTER — Other Ambulatory Visit: Payer: Self-pay | Admitting: Cardiology

## 2017-03-01 ENCOUNTER — Encounter: Payer: Self-pay | Admitting: Gastroenterology

## 2017-04-01 ENCOUNTER — Other Ambulatory Visit: Payer: Self-pay | Admitting: Cardiology

## 2017-08-30 ENCOUNTER — Other Ambulatory Visit: Payer: Self-pay | Admitting: Cardiology

## 2017-10-04 ENCOUNTER — Other Ambulatory Visit: Payer: Self-pay | Admitting: Cardiology

## 2017-10-18 ENCOUNTER — Telehealth: Payer: Self-pay | Admitting: Cardiology

## 2017-10-18 NOTE — Telephone Encounter (Signed)
New Message:    *STAT* If patient is at the pharmacy, call can be transferred to refill team.   1. Which medications need to be refilled? (please list name of each medication and dose if known) metoprolol succinate (TOPROL-XL) 50 MG 24 hr tablet  2. Which pharmacy/location (including street and city if local pharmacy) is medication to be sent to?     [facility], [place]      3. Do they need a 30 day or 90 day supply? 90 Days

## 2017-10-19 ENCOUNTER — Other Ambulatory Visit: Payer: Self-pay | Admitting: *Deleted

## 2017-10-19 MED ORDER — METOPROLOL SUCCINATE ER 50 MG PO TB24: ORAL_TABLET | ORAL | 1 refills | Status: DC

## 2017-10-23 ENCOUNTER — Other Ambulatory Visit: Payer: Self-pay | Admitting: Cardiology

## 2017-11-12 ENCOUNTER — Other Ambulatory Visit: Payer: Self-pay | Admitting: Cardiology

## 2017-11-14 ENCOUNTER — Other Ambulatory Visit: Payer: Self-pay | Admitting: Cardiology

## 2017-11-15 ENCOUNTER — Other Ambulatory Visit: Payer: Self-pay | Admitting: Cardiology

## 2017-11-15 DIAGNOSIS — M545 Low back pain, unspecified: Secondary | ICD-10-CM | POA: Insufficient documentation

## 2017-11-15 NOTE — Telephone Encounter (Signed)
There is also Benicar 40mg  daily on her MAR  Please make sure she is only taking 1 tablet of the Benicar 40mg 

## 2017-11-15 NOTE — Telephone Encounter (Signed)
Pt's medication of olmesartan-hydrochlorothiazide 40-25 mg tablet is on backorder and would like to see if Dr. [name] would split the two medications in two separate medications. Please address

## 2017-12-21 ENCOUNTER — Ambulatory Visit (INDEPENDENT_AMBULATORY_CARE_PROVIDER_SITE_OTHER): Payer: Medicare Other | Admitting: Cardiology

## 2017-12-21 ENCOUNTER — Encounter: Payer: Self-pay | Admitting: Cardiology

## 2017-12-21 VITALS — BP 120/72 | HR 50 | Ht 65.0 in | Wt 199.0 lb

## 2017-12-21 DIAGNOSIS — I4510 Unspecified right bundle-branch block: Secondary | ICD-10-CM

## 2017-12-21 DIAGNOSIS — I10 Essential (primary) hypertension: Secondary | ICD-10-CM

## 2017-12-21 DIAGNOSIS — I493 Ventricular premature depolarization: Secondary | ICD-10-CM | POA: Diagnosis not present

## 2017-12-21 MED ORDER — METOPROLOL SUCCINATE ER 50 MG PO TB24: ORAL_TABLET | ORAL | 3 refills | Status: DC

## 2017-12-21 NOTE — Patient Instructions (Signed)
Medication Instructions:  Your physician recommends that you continue on your current medications as directed. Please refer to the Current Medication list given to you today.  If you need a refill on your cardiac medications before your next appointment, please call your pharmacy.   Lab work: None Ordered  If you have labs (blood work) drawn today and your tests are completely normal, you will receive your results only by: [name] MyChart Message (if you have MyChart) OR . A paper copy in the mail If you have any lab test that is abnormal or we need to change your treatment, we will call you to review the results.  Testing/Procedures: Your physician has recommended that you wear a holter monitor. Holter monitors are medical devices that record the heart's electrical activity. Doctors most often use these monitors to diagnose arrhythmias. Arrhythmias are problems with the speed or rhythm of the heartbeat. The monitor is a small, portable device. You can wear one while you do your normal daily activities. This is usually used to diagnose what is causing palpitations/syncope (passing out).  Follow-Up: At [facility], you and your health needs are our priority.  As part of our continuing mission to provide you with exceptional heart care, we have created designated Provider Care Teams.  These Care Teams include your primary Cardiologist (physician) and Advanced Practice Providers (APPs -  Physician Assistants and Nurse Practitioners) who all work together to provide you with the care you need, when you need it. . You will need a follow up appointment in 1 year.  Please call our office 2 months in advance to schedule this appointment.  You may see [name], MD or one of the following Advanced Practice Providers on your designated Care Team:   . Lyda Jester, PA-C . Dayna Dunn, PA-C . Ermalinda Barrios, PA-C  Any Other Special Instructions Will Be Listed Below (If Applicable).

## 2017-12-21 NOTE — Progress Notes (Signed)
Cardiology Office Note:    Date:  12/21/2017   ID:  [name], DOB [birth year], MRN [id number]  PCP:  [name], MD  Cardiologist:  No primary care provider on file.    Referring MD: [name], MD   Chief Complaint  Patient presents with  . Hypertension    History of Present Illness:    [name] is a 77 y.o. female with a hx of  HTN and RBBB. She is here today for followup and is doing well.  She denies any chest pain or pressure, SOB, DOE, PND, orthopnea, LE edema, dizziness, palpitations or syncope. She is compliant with her meds and is tolerating meds with no SE.    Past Medical History:  Diagnosis Date  . Allergy   . Anemia    hx of years ago   . Anxiety   . Arthritis   . Blood transfusion without reported diagnosis    after hip replacement 2013  . Cataract    removed both eyes  . Chronic kidney disease    hx of uti   . Colon polyp   . Depression   . DJD (degenerative joint disease)   . GERD (gastroesophageal reflux disease)   . Hypertension   . Lumbar radiculopathy    S/P ESI- Dr Maxie Better Dr. Nelva Bush  . Neuromuscular disorder (HCC)    numbness in middle toes on left leg   . Osteoporosis   . Pneumonia    hx of   . RBBB   . Seasonal allergies   . Syncope and collapse    in the setting of diarrheal illness-Dr turner    Past Surgical History:  Procedure Laterality Date  . BACK SURGERY  2006  . CARDIAC CATHETERIZATION  2003   Normal coronary arteries  . COLONOSCOPY    . POLYPECTOMY    . [place]  . TOTAL HIP ARTHROPLASTY  2008   right  . TOTAL HIP ARTHROPLASTY  12/16/2011   Procedure: TOTAL HIP ARTHROPLASTY;  Surgeon: [name], MD;  Location: [facility];  Service: Orthopedics;  Laterality: Left;  [name] VAGINAL HYSTERECTOMY  1981  . WISDOM TOOTH EXTRACTION      Current Medications: Current Meds  Medication Sig  . b complex vitamins capsule Take 1 capsule by mouth daily.  . Calcium Carbonate-Vitamin D (CALCIUM-D PO) Take  1,200 mg by mouth daily.   . cephALEXin (KEFLEX) 250 MG capsule Take 250 mg by mouth daily.   [name] conjugated estrogens (PREMARIN) vaginal cream Place 1 Applicatorful vaginally daily.  [name] Desonide Crea-Moisturizing Lot 0.05 % KIT Apply 1 Dose topically as directed. Uses every other day as a preventive for fungus  Under breasts and abdominal area   . fish oil-omega-3 fatty acids 1000 MG capsule Take 1 g by mouth daily.   . Ginger, Zingiber officinalis, (GINGER ROOT PO) Take by mouth.  . Glucosamine HCl (GLUCOSAMINE PO) Take 1 capsule by mouth 2 (two) times daily.  . hydrochlorothiazide (HYDRODIURIL) 25 MG tablet TAKE 1 TABLET BY MOUTH DAILY.  [name] HYDROcodone-acetaminophen (NORCO/VICODIN) 5-325 MG per tablet Take 0.5 tablets by mouth every 6 (six) hours as needed for moderate pain.  [name] loratadine (CLARITIN) 10 MG tablet Take 10 mg by mouth daily.  [name] LORazepam (ATIVAN) 1 MG tablet Take 0.5 mg by mouth at bedtime. scheduled  . metoprolol succinate (TOPROL-XL) 50 MG 24 hr tablet TAKE 1&1/2 TABLETS BY MOUTH ONCE DAILY.Please keep upcoming appointment for further refills  . Multiple  Vitamin (MULTIVITAMIN) tablet Take 1 tablet by mouth daily.  [name] olmesartan-hydrochlorothiazide (BENICAR HCT) 40-25 MG tablet TAKE 1 TABLET ONCE DAILY.  [name] PARoxetine (PAXIL) 40 MG tablet Take 40 mg by mouth daily before breakfast.   . potassium chloride SA (K-DUR,KLOR-CON) 20 MEQ tablet Take 1 tablet (20 mEq total) by mouth 2 (two) times daily. Please keep upcoming appt with Dr.Turner for November for future refillsThank you  . TURMERIC PO Take 1 capsule by mouth daily.  . vitamin C (ASCORBIC ACID) 500 MG tablet Take 1,000 mg by mouth daily.    Current Facility-Administered Medications for the 12/21/17 encounter (Office Visit) with Sueanne Margarita, MD  Medication  . 0.9 %  sodium chloride infusion     Allergies:   Ciprocin-fluocin-procin [fluocinolone]; Citric acid; Cleocin [clindamycin hcl]; Cranberry; Iodine; and Septra  [sulfamethoxazole-trimethoprim]   Social History   Socioeconomic History  . Marital status: Widowed    Spouse name: Not on file  . Number of children: Not on file  . Years of education: Not on file  . Highest education level: Not on file  Occupational History  . Occupation: Retired Education officer, museum  Social Needs  . Financial resource strain: Not on file  . Food insecurity:    Worry: Not on file    Inability: Not on file  . Transportation needs:    Medical: Not on file    Non-medical: Not on file  Tobacco Use  . Smoking status: Never Smoker  . Smokeless tobacco: Never Used  Substance and Sexual Activity  . Alcohol use: No  . Drug use: No  . Sexual activity: Not on file  Lifestyle  . Physical activity:    Days per week: Not on file    Minutes per session: Not on file  . Stress: Not on file  Relationships  . Social connections:    Talks on phone: Not on file    Gets together: Not on file    Attends religious service: Not on file    Active member of club or organization: Not on file    Attends meetings of clubs or organizations: Not on file    Relationship status: Not on file  Other Topics Concern  . Not on file  Social History Narrative   Widowed.  Lived with a companion prior to going to [facility] for rehab.     Family History: The patient's family history includes Colon cancer (age of onset: 70) in her mother; Esophageal cancer in her paternal uncle; Heart failure in her father; Lung cancer in her mother; Pancreatic cancer in her father; Skin cancer in her sister. There is no history of Stomach cancer, Colon polyps, or Rectal cancer.  ROS:   Please see the history of present illness.    ROS  All other systems reviewed and negative.   EKGs/Labs/Other Studies Reviewed:    The following studies were reviewed today: none  EKG:  EKG is  ordered today.  The ekg ordered today demonstrates normal sinus rhythm at 61 bpm with occasional PVCs, right bundle branch block  with inferior ST-T wave of normality likely related to underlying conduction abnormality.  Change from EKG 11/07/2016  Recent Labs: No results found for requested labs within last 8760 hours.   Recent Lipid Panel No results found for: CHOL, TRIG, HDL, CHOLHDL, VLDL, LDLCALC, LDLDIRECT  Physical Exam:    VS:  BP 120/72   Pulse (!) 50   Ht _0  (1.651 m)   Wt 199 lb (  90.3 kg)   SpO2 98%   BMI 33.12 kg/m     Wt Readings from Last 3 Encounters:  12/21/17 199 lb (90.3 kg)  02/23/17 200 lb (90.7 kg)  02/10/17 200 lb (90.7 kg)     GEN:  Well nourished, well developed in no acute distress HEENT: Normal NECK: No JVD; No carotid bruits LYMPHATICS: No lymphadenopathy CARDIAC: RRR, no murmurs, rubs, gallops RESPIRATORY:  Clear to auscultation without rales, wheezing or rhonchi  ABDOMEN: Soft, non-tender, non-distended MUSCULOSKELETAL:  No edema; No deformity  SKIN: Warm and dry NEUROLOGIC:  Alert and oriented x 3 PSYCHIATRIC:  Normal affect   ASSESSMENT:    1. Essential hypertension   2. RBBB   3. PVC's (premature ventricular contractions)    PLAN:    In order of problems listed above:  1.  HTN -BP is well controlled on exam today.  She will continue on Toprol-XL 75 mg daily, Benicar 40 mg daily and HCTZ 25 mg mg daily.  Creatinine was 1.03 and potassium 4.4 on 09/07/2017.  2.  RBBB -this is chronic and asymptomatic.  3.  PVCs -she has some ectopy on exam today and EKG shows occasional PVCs.  I am to get a 24-hour Holter monitor to assess PVC load.  She does drink caffeine and I told her if she has a significant PVC load and she may need to cut back on caffeine and I will let her know.  She is completely asymptomatic with these and will continue on current dose of beta-blocker which I will refill today.   Medication Adjustments/Labs and Tests Ordered: Current medicines are reviewed at length with the patient today.  Concerns regarding medicines are outlined above.  Orders  Placed This Encounter  Procedures  . EKG 12-Lead   No orders of the defined types were placed in this encounter.   Signed, [name], MD  12/21/2017 4:14 PM    [facility]

## 2018-01-03 ENCOUNTER — Ambulatory Visit (INDEPENDENT_AMBULATORY_CARE_PROVIDER_SITE_OTHER): Payer: Medicare Other

## 2018-01-03 DIAGNOSIS — I493 Ventricular premature depolarization: Secondary | ICD-10-CM

## 2018-01-09 ENCOUNTER — Telehealth: Payer: Self-pay

## 2018-01-09 DIAGNOSIS — I493 Ventricular premature depolarization: Secondary | ICD-10-CM

## 2018-01-09 NOTE — Telephone Encounter (Signed)
Spoke with the patient, she expressed understanding about her Holter monitor results. She accepted the EP referral, echo and lexiscan myoview. The patient was given verbal instructions and a printed copy will be placed at the front. She had no further questions.

## 2018-01-09 NOTE — Telephone Encounter (Signed)
-----   Message from Sueanne Margarita, MD sent at 01/06/2018  7:38 PM EST ----- Heart monitor showed frequent PVCs and PACs.  Her PVC load is high at 15%.  Please refer to Dr. Curt Bears for further evaluation

## 2018-01-23 ENCOUNTER — Telehealth (HOSPITAL_COMMUNITY): Payer: Self-pay

## 2018-01-23 NOTE — Telephone Encounter (Signed)
Pt contacted and instructions given. S.Shona Pardo EMTP

## 2018-01-24 ENCOUNTER — Encounter: Payer: Self-pay | Admitting: Cardiology

## 2018-01-25 ENCOUNTER — Ambulatory Visit (HOSPITAL_BASED_OUTPATIENT_CLINIC_OR_DEPARTMENT_OTHER): Payer: Medicare Other

## 2018-01-25 ENCOUNTER — Other Ambulatory Visit: Payer: Self-pay

## 2018-01-25 ENCOUNTER — Ambulatory Visit (HOSPITAL_COMMUNITY): Payer: Medicare Other | Attending: Internal Medicine

## 2018-01-25 DIAGNOSIS — I493 Ventricular premature depolarization: Secondary | ICD-10-CM

## 2018-01-25 LAB — MYOCARDIAL PERFUSION IMAGING
CHL CUP NUCLEAR SSS: 2
CSEPPHR: 69 {beats}/min
LV dias vol: 100 mL (ref 46–106)
LVSYSVOL: 43 mL
Rest HR: 56 {beats}/min
SDS: 2
SRS: 0
TID: 0.96

## 2018-01-25 MED ORDER — TECHNETIUM TC 99M TETROFOSMIN IV KIT
10.3000 | PACK | Freq: Once | INTRAVENOUS | Status: AC | PRN
  Administered 2018-01-25: 10.3 via INTRAVENOUS
  Filled 2018-01-25: qty 11

## 2018-01-25 MED ORDER — REGADENOSON 0.4 MG/5ML IV SOLN
0.4000 mg | Freq: Once | INTRAVENOUS | Status: AC
  Administered 2018-01-25: 0.4 mg via INTRAVENOUS

## 2018-01-25 MED ORDER — TECHNETIUM TC 99M TETROFOSMIN IV KIT
32.5000 | PACK | Freq: Once | INTRAVENOUS | Status: AC | PRN
  Administered 2018-01-25: 32.5 via INTRAVENOUS
  Filled 2018-01-25: qty 33

## 2018-02-16 ENCOUNTER — Institutional Professional Consult (permissible substitution): Payer: Medicare Other | Admitting: Cardiology

## 2018-02-16 NOTE — Progress Notes (Deleted)
Electrophysiology Office Note   Date:  02/16/2018   ID:  Sydney, Rangel 06-Mar-1940, MRN [id number]  PCP:  [name], MD  Cardiologist:  [name] Primary Electrophysiologist:  [name] [name], MD    No chief complaint on file.    History of Present Illness: [name] is a 78 y.o. female who is being seen today for the evaluation of PVCs at the request of [name]. Presenting today for electrophysiology evaluation.  She has a history significant for hypertension and right bundle branch block.  He wore a Holter monitor which showed 15% PVCs.  Today, she denies*** symptoms of palpitations, chest pain, shortness of breath, orthopnea, PND, lower extremity edema, claudication, dizziness, presyncope, syncope, bleeding, or neurologic sequela. The patient is tolerating medications without difficulties.    Past Medical History:  Diagnosis Date  . Allergy   . Anemia    hx of years ago   . Anxiety   . Arthritis   . Blood transfusion without reported diagnosis    after hip replacement 2013  . Cataract    removed both eyes  . Chronic kidney disease    hx of uti   . Colon polyp   . Depression   . DJD (degenerative joint disease)   . GERD (gastroesophageal reflux disease)   . Hypertension   . Lumbar radiculopathy    S/P ESI- Dr Maxie Better Dr. Nelva Bush  . Neuromuscular disorder (HCC)    numbness in middle toes on left leg   . Osteoporosis   . Pneumonia    hx of   . RBBB   . Seasonal allergies   . Syncope and collapse    in the setting of diarrheal illness-Dr turner   Past Surgical History:  Procedure Laterality Date  . BACK SURGERY  2006  . CARDIAC CATHETERIZATION  2003   Normal coronary arteries  . COLONOSCOPY    . POLYPECTOMY    . [place]  . TOTAL HIP ARTHROPLASTY  2008   right  . TOTAL HIP ARTHROPLASTY  12/16/2011   Procedure: TOTAL HIP ARTHROPLASTY;  Surgeon: [name], MD;  Location: [facility];  Service: Orthopedics;  Laterality: Left;    [name] VAGINAL HYSTERECTOMY  1981  . WISDOM TOOTH EXTRACTION       Current Outpatient Medications  Medication Sig Dispense Refill  . b complex vitamins capsule Take 1 capsule by mouth daily.    . Calcium Carbonate-Vitamin D (CALCIUM-D PO) Take 1,200 mg by mouth daily.     . cephALEXin (KEFLEX) 250 MG capsule Take 250 mg by mouth daily.     [name] conjugated estrogens (PREMARIN) vaginal cream Place 1 Applicatorful vaginally daily.    [name] Desonide Crea-Moisturizing Lot 0.05 % KIT Apply 1 Dose topically as directed. Uses every other day as a preventive for fungus  Under breasts and abdominal area     . fish oil-omega-3 fatty acids 1000 MG capsule Take 1 g by mouth daily.     . Ginger, Zingiber officinalis, (GINGER ROOT PO) Take by mouth.    . Glucosamine HCl (GLUCOSAMINE PO) Take 1 capsule by mouth 2 (two) times daily.    . hydrochlorothiazide (HYDRODIURIL) 25 MG tablet TAKE 1 TABLET BY MOUTH DAILY. 90 tablet 0  . HYDROcodone-acetaminophen (NORCO/VICODIN) 5-325 MG per tablet Take 0.5 tablets by mouth every 6 (six) hours as needed for moderate pain.    [name] loratadine (CLARITIN) 10 MG tablet Take 10 mg by mouth daily.    [name]  LORazepam (ATIVAN) 1 MG tablet Take 0.5 mg by mouth at bedtime. scheduled    . metoprolol succinate (TOPROL-XL) 50 MG 24 hr tablet TAKE 1&1/2 TABLETS BY MOUTH ONCE DAILY. 135 tablet 3  . Multiple Vitamin (MULTIVITAMIN) tablet Take 1 tablet by mouth daily.    [name] olmesartan-hydrochlorothiazide (BENICAR HCT) 40-25 MG tablet TAKE 1 TABLET ONCE DAILY. 90 tablet 0  . PARoxetine (PAXIL) 40 MG tablet Take 40 mg by mouth daily before breakfast.     . potassium chloride SA (K-DUR,KLOR-CON) 20 MEQ tablet Take 1 tablet (20 mEq total) by mouth 2 (two) times daily. Please keep upcoming appt with Dr.Turner for November for future refillsThank you 180 tablet 0  . TURMERIC PO Take 1 capsule by mouth daily.    . vitamin C (ASCORBIC ACID) 500 MG tablet Take 1,000 mg by mouth daily.      Current  Facility-Administered Medications  Medication Dose Route Frequency Provider Last Rate Last Dose  . 0.9 %  sodium chloride infusion  500 mL Intravenous Once [name], [name], MD        Allergies:   Ciprocin-fluocin-procin [fluocinolone]; Citric acid; Cleocin [clindamycin hcl]; Cranberry; Iodine; and Septra [sulfamethoxazole-trimethoprim]   Social History:  The patient  reports that she has never smoked. She has never used smokeless tobacco. She reports that she does not drink alcohol or use drugs.   Family History:  The patient's family history includes Colon cancer (age of onset: 42) in her mother; Esophageal cancer in her paternal uncle; Heart failure in her father; Lung cancer in her mother; Pancreatic cancer in her father; Skin cancer in her sister.    ROS:  Please see the history of present illness.   Otherwise, review of systems is positive for ***.   All other systems are reviewed and negative.    PHYSICAL EXAM: VS:  There were no vitals taken for this visit. , BMI There is no height or weight on file to calculate BMI. GEN: Well nourished, well developed, in no acute distress  HEENT: normal  Neck: no JVD, carotid bruits, or masses Cardiac: ***RRR; no murmurs, rubs, or gallops,no edema  Respiratory:  clear to auscultation bilaterally, normal work of breathing GI: soft, nontender, nondistended, + BS MS: no deformity or atrophy  Skin: warm and dry Neuro:  Strength and sensation are intact Psych: euthymic mood, full affect  EKG:  EKG {ACTION; IS/IS NOI:[id number]} ordered today. Personal review of the ekg ordered shows ***  Recent Labs: No results found for requested labs within last 8760 hours.    Lipid Panel  No results found for: CHOL, TRIG, HDL, CHOLHDL, VLDL, LDLCALC, LDLDIRECT   Wt Readings from Last 3 Encounters:  01/25/18 199 lb (90.3 kg)  12/21/17 199 lb (90.3 kg)  02/23/17 200 lb (90.7 kg)      Other studies Reviewed: Additional studies/ records that were  reviewed today include: TTE 01/25/18  Review of the above records today demonstrates:  - Left ventricle: The cavity size was normal. Wall thickness was   normal. Systolic function was normal. The estimated ejection   fraction was in the range of 50% to 55%. Wall motion was normal;   there were no regional wall motion abnormalities. - Mitral valve: There was mild regurgitation.  SPECT 01/25/18  Nuclear stress EF: 57%.  The left ventricular ejection fraction is normal (55-65%).  There was no ST segment deviation noted during stress.  The study is normal.  This is a low risk study.  Holter 01/06/18 - personally reviewed  Sinus bradycardia, normal sinus rhythm and sinus tachycardia. The average heart rate is 73bpm. The heart rate ranged from 50 to 107bpm.  Frequent PVCs, bigeminal PVCs with PVC load 15.19%.  Nonsustained atrial tachycardia up to 20 beats, frequent PACs and bigeminal PACs.  Sustained SVT at 107bpm   ASSESSMENT AND PLAN:  1.  PVCs: 15% noted on cardiac monitor.  Echo shows a normal ejection fraction, no ischemia on SPECT.***  2.  Hypertension:***  Current medicines are reviewed at length with the patient today.   The patient {ACTIONS; HAS/DOES NOT HAVE:19233} concerns regarding her medicines.  The following changes were made today:  {NONE DEFAULTED:18576::"none"}  Labs/ tests ordered today include: *** No orders of the defined types were placed in this encounter.    Disposition:   FU with Karisa Nesser {gen number [id number]} {Days to years:10300}  Signed, Verne Lanuza [name], MD  02/16/2018 12:21 PM     [facility] [address] [place] [address] [phone] (office) [phone] (fax)

## 2018-02-20 ENCOUNTER — Other Ambulatory Visit: Payer: Self-pay | Admitting: Cardiology

## 2018-03-07 ENCOUNTER — Encounter: Payer: Self-pay | Admitting: *Deleted

## 2018-03-07 ENCOUNTER — Other Ambulatory Visit: Payer: Self-pay | Admitting: Internal Medicine

## 2018-03-07 DIAGNOSIS — M8580 Other specified disorders of bone density and structure, unspecified site: Secondary | ICD-10-CM

## 2018-03-16 ENCOUNTER — Ambulatory Visit: Payer: Medicare Other | Admitting: Cardiology

## 2018-03-16 ENCOUNTER — Encounter: Payer: Self-pay | Admitting: Cardiology

## 2018-03-16 VITALS — BP 124/80 | HR 68 | Ht 65.0 in | Wt 197.0 lb

## 2018-03-16 DIAGNOSIS — I493 Ventricular premature depolarization: Secondary | ICD-10-CM | POA: Diagnosis not present

## 2018-03-16 MED ORDER — FLECAINIDE ACETATE 50 MG PO TABS: 50.0000 mg | ORAL_TABLET | Freq: Two times a day (BID) | ORAL | 3 refills | Status: DC

## 2018-03-16 NOTE — Patient Instructions (Addendum)
Medication Instructions:  Your physician has recommended you make the following change in your medication:  1. START Flecainide 50 mg twice daily  * If you need a refill on your cardiac medications before your next appointment, please call your pharmacy.   Labwork: None ordered  Testing/Procedures: Your physician has recommended that you wear a 24 holter monitor - complete this 1 to 2 weeks before your follow up with Dr. Curt Bears.. Holter monitors are medical devices that record the heart's electrical activity. Doctors most often use these monitors to diagnose arrhythmias. Arrhythmias are problems with the speed or rhythm of the heartbeat. The monitor is a small, portable device. You can wear one while you do your normal daily activities. This is usually used to diagnose what is causing palpitations/syncope (passing out).  Follow-Up: Your physician recommends that you schedule a follow-up appointment in: 1 week for nurse visit EKG after starting Flecainide.  Your physician recommends that you schedule a follow-up appointment in: 3 months with Dr. Curt Bears.   Thank you for choosing [facility]!!   Trinidad Curet, RN [phone]  Any Other Special Instructions Will Be Listed Below (If Applicable).  Flecainide tablets What is this medicine? FLECAINIDE (FLEK a nide) is an antiarrhythmic drug. This medicine is used to prevent irregular heart rhythm. It can also slow down fast heartbeats called tachycardia. This medicine may be used for other purposes; ask your health care provider or pharmacist if you have questions. COMMON BRAND NAME(S): Tambocor What should I tell my health care provider before I take this medicine? They need to know if you have any of these conditions: -abnormal levels of potassium in the blood -heart disease including heart rhythm and heart rate problems -kidney or liver disease -recent heart attack -an unusual or allergic reaction to flecainide, local  anesthetics, other medicines, foods, dyes, or preservatives -pregnant or trying to get pregnant -breast-feeding How should I use this medicine? Take this medicine by mouth with a glass of water. Follow the directions on the prescription label. You can take this medicine with or without food. Take your doses at regular intervals. Do not take your medicine more often than directed. Do not stop taking this medicine suddenly. This may cause serious, heart-related side effects. If your doctor wants you to stop the medicine, the dose may be slowly lowered over time to avoid any side effects. Talk to your pediatrician regarding the use of this medicine in children. While this drug may be prescribed for children as young as 1 year of age for selected conditions, precautions do apply. Overdosage: If you think you have taken too much of this medicine contact a poison control center or emergency room at once. NOTE: This medicine is only for you. Do not share this medicine with others. What if I miss a dose? If you miss a dose, take it as soon as you can. If it is almost time for your next dose, take only that dose. Do not take double or extra doses. What may interact with this medicine? Do not take this medicine with any of the following medications: -amoxapine -arsenic trioxide -certain antibiotics like clarithromycin, erythromycin, gatifloxacin, gemifloxacin, levofloxacin, moxifloxacin, sparfloxacin, or troleandomycin -certain antidepressants called tricyclic antidepressants like amitriptyline, imipramine, or nortriptyline -certain medicines to control heart rhythm like disopyramide, dofetilide, encainide, moricizine, procainamide, propafenone, and quinidine -cisapride -cyclobenzaprine -delavirdine -droperidol -haloperidol -hawthorn -imatinib -levomethadyl -maprotiline -medicines for malaria like chloroquine and halofantrine -pentamidine -phenothiazines like chlorpromazine, mesoridazine,  prochlorperazine, thioridazine -pimozide -quinine -ranolazine -ritonavir -sertindole -  ziprasidone This medicine may also interact with the following medications: -cimetidine -medicines for angina or high blood pressure -medicines to control heart rhythm like amiodarone and digoxin This list may not describe all possible interactions. Give your health care provider a list of all the medicines, herbs, non-prescription drugs, or dietary supplements you use. Also tell them if you smoke, drink alcohol, or use illegal drugs. Some items may interact with your medicine. What should I watch for while using this medicine? Visit your doctor or health care professional for regular checks on your progress. Because your condition and the use of this medicine carries some risk, it is a good idea to carry an identification card, necklace or bracelet with details of your condition, medications and doctor or health care professional. Check your blood pressure and pulse rate regularly. Ask your health care professional what your blood pressure and pulse rate should be, and when you should contact him or her. Your doctor or health care professional also may schedule regular blood tests and electrocardiograms to check your progress. You may get drowsy or dizzy. Do not drive, use machinery, or do anything that needs mental alertness until you know how this medicine affects you. Do not stand or sit up quickly, especially if you are an older patient. This reduces the risk of dizzy or fainting spells. Alcohol can make you more dizzy, increase flushing and rapid heartbeats. Avoid alcoholic drinks. What side effects may I notice from receiving this medicine? Side effects that you should report to your doctor or health care professional as soon as possible: -chest pain, continued irregular heartbeats -difficulty breathing -swelling of the legs or feet -trembling, shaking -unusually weak or tired Side effects that usually  do not require medical attention (report to your doctor or health care professional if they continue or are bothersome): -blurred vision -constipation -headache -nausea, vomiting -stomach pain This list may not describe all possible side effects. Call your doctor for medical advice about side effects. You may report side effects to FDA at 1-800-FDA-1088. Where should I keep my medicine? Keep out of the reach of children. Store at room temperature between 15 and 30 degrees C (59 and 86 degrees F). Protect from light. Keep container tightly closed. Throw away any unused medicine after the expiration date. NOTE: This sheet is a summary. It may not cover all possible information. If you have questions about this medicine, talk to your doctor, pharmacist, or health care provider.  2019 Elsevier/Gold Standard (2007-06-06 16:46:09)    Exercise Stress Test  An exercise stress test is a test that is done to collect information about how your heart functions during exercise. The test is done while you are walking on a treadmill or using an exercise bike. The goal is to raise your heart rate and "stress" the heart. The heart is evaluated before, during, and after you exercise. An electrocardiogram (ECG) will be used to monitor the heart, and your blood pressure will also be monitored. In some cases, nuclear scanning or an ultrasound of the heart (echocardiogram) will also be done to evaluate your heart. An exercise stress test is done to look for coronary artery disease (CAD). The test may also be done to:  Evaluate your limits of exercise during cardiac rehabilitation.  Check for high blood pressure during exercise.  Check how well you can exercise after such treatments as coronary stenting or new medicines.  Check for problems with blood flow to your arms and legs during exercise. If you have  an abnormal test result, this may mean that you are not getting enough blood flow to your heart during  exercise. More testing may be needed to understand why your test was not normal. Tell a health care provider about:  Any allergies you have.  All medicines you are taking, including vitamins, herbs, eye drops, creams, and over-the-counter medicines.  Any blood disorders you have.  Any surgeries you have had.  Any medical conditions you have.  Whether you are pregnant or may be pregnant. What are the risks? Generally, this is a safe procedure. However, problems may occur, including:  Pain or pressure in the following areas: ? Chest. ? Jaw or neck. ? Between your shoulder blades. ? Down your left arm.  Dizziness or lightheadedness.  Shortness of breath.  Increased or irregular heartbeats.  Nausea or vomiting.  Heart attack (rare).  Life-threatening abnormal heart rhythm (rare). What happens before the procedure?  Follow instructions from your health care provider about eating or drinking restrictions. ? You may be told to avoid all forms of caffeine for 24 hours before the test. This includes coffee, tea (even decaffeinated tea), caffeinated sodas, chocolate, cocoa, and certain pain medicines.  Ask your health care provider about: ? Taking over-the-counter medicines, vitamins, herbs, and supplements. ? Changing or stopping your regular medicines. This is especially important if you are taking diabetes medicines or beta-blocker medicines.  If you have diabetes, ask how you are to take your insulin or pills. It is common to adjust your insulin dose the morning of the test.  If you are taking beta-blocker medicines, it is important to talk to your health care provider about these medicines well before the date of your test. Taking beta-blocker medicines may interfere with the test. In some cases, these medicines may need to be changed or stopped 24 hours or more before the test.  If you wear a nitroglycerin patch, it may need to be removed prior to the test. Ask your health  care provider if the patch should be removed before the test.  If you use an inhaler for any breathing condition, bring it with you to the test.  Do not apply lotions, powders, creams, or oils on your chest prior to the test.  Wear loose-fitting clothes and comfortable walking shoes.  Do not use any products that contain nicotine or tobacco, such as cigarettes and e-cigarettes, for 4 hours before the test or as told by your health care provider. If you need help quitting, ask your health care provider. What happens during the procedure?  Multiple electrodes will be attached to your chest.  Multiple wires will be attached to the electrodes. These will transfer the electrical impulses from your heart to the ECG machine. Your heart will be monitored both at rest and while exercising.  If you are also having an echocardiogram or nuclear scanning, images of your heart will be taken before and after you exercise.  A blood pressure cuff will be placed around your arm to measure your blood pressure throughout the test. You will feel it tighten and loosen throughout the test.  You will walk on a treadmill or use a stationary bike. If you cannot use these, you may be asked to turn a crank with your hands.  You will start at a slow pace or level on the exercise machine. The exercise difficulty will be slowly increased to raise your heart rate. In the case of a treadmill, the speed and incline will gradually be increased.  You may be asked to periodically breathe into a tube. This measures the gases you breathe out.  You will be asked how you are feeling throughout the test. You will be asked to rate your level of exertion.  Tell the staff right away if you feel: ? Chest pain. ? Dizziness. ? Shortness of breath. ? Too fatigued to continue. ? Pain or aching in your legs or arms.  You will exercise until you have symptoms or until you reach a target heart rate. The test will also be stopped if  you have changes in your blood pressure or ECG readings, or if you develop an irregular heartbeat (arrhythmia). The procedure may vary among health care providers and hospitals. What happens after the procedure?  You will sit down and recover from the exercise. Your blood pressure, heart rate, and ECG will be monitored until you recover.  You may return to your normal schedule, including diet, activities, and medicines, unless your health care provider tells you otherwise.  It is up to you to get your test results. Ask your health care provider, or the department that is doing the test, when your results will be ready. Summary  An exercise stress test is a test that is done to collect information about how your heart functions during exercise.  This test is done to look for coronary artery disease (CAD).  During this test, you will walk on a treadmill or use an exercise bike to raise your heart rate.  It is important to follow instructions from your health care provider about eating and drinking restrictions before the test. This may include avoiding caffeine and certain medicines before the test. This information is not intended to replace advice given to you by your health care provider. Make sure you discuss any questions you have with your health care provider. Document Released: 01/29/2000 Document Revised: 04/06/2016 Document Reviewed: 04/06/2016 Elsevier Interactive Patient Education  2019 [employer].

## 2018-03-16 NOTE — Progress Notes (Signed)
Electrophysiology Office Note   Date:  03/16/2018   ID:  [name], [name] 1940-11-06, MRN [id number]  PCP:  [name], MD  Cardiologist:  [name] Primary Electrophysiologist:  Will [name], MD    No chief complaint on file.    History of Present Illness: [name] is a 78 y.o. female who is being seen today for the evaluation of PVCs at the request of [name]. Presenting today for electrophysiology evaluation.  Serial hypertension right bundle branch block.  [name] was noted to have PVCs on her EKG and thus a 24-hour monitor was placed showing 15% PVCs.    Today, [name] denies symptoms of chest pain, shortness of breath, orthopnea, PND, lower extremity edema, claudication, dizziness, presyncope, syncope, bleeding, or neurologic sequela. The patient is tolerating medications without difficulties.  Symptoms today are of weakness and fatigue.  [name] has no chest pain or shortness of breath.  Her symptoms occur most days.  [name] was unaware of the symptoms until [name] was told her that [name] had PVCs.   Past Medical History:  Diagnosis Date  . Allergy   . Anemia    hx of years ago   . Anxiety   . Arthritis   . Blood transfusion without reported diagnosis    after hip replacement 2013  . Cataract    removed both eyes  . Chronic kidney disease    hx of uti   . Colon polyp   . Depression   . DJD (degenerative joint disease)   . GERD (gastroesophageal reflux disease)   . Hypertension   . Lumbar radiculopathy    S/P ESI- Dr Maxie Better Dr. Nelva Bush  . Neuromuscular disorder (HCC)    numbness in middle toes on left leg   . Osteoporosis   . Pneumonia    hx of   . RBBB   . Seasonal allergies   . Syncope and collapse    in the setting of diarrheal illness-Dr turner   Past Surgical History:  Procedure Laterality Date  . BACK SURGERY  2006  . CARDIAC CATHETERIZATION  2003   Normal coronary arteries  . COLONOSCOPY    . POLYPECTOMY    . [place]  . TOTAL  HIP ARTHROPLASTY  2008   right  . TOTAL HIP ARTHROPLASTY  12/16/2011   Procedure: TOTAL HIP ARTHROPLASTY;  Surgeon: [name], MD;  Location: [facility];  Service: Orthopedics;  Laterality: Left;  [name] VAGINAL HYSTERECTOMY  1981  . WISDOM TOOTH EXTRACTION       Current Outpatient Medications  Medication Sig Dispense Refill  . b complex vitamins capsule Take 1 capsule by mouth daily.    . Calcium Carbonate-Vitamin D (CALCIUM-D PO) Take 1,200 mg by mouth daily.     . cephALEXin (KEFLEX) 250 MG capsule Take 250 mg by mouth daily.     [name] conjugated estrogens (PREMARIN) vaginal cream Place 1 Applicatorful vaginally once a week.     [name] Desonide Crea-Moisturizing Lot 0.05 % KIT Apply 1 Dose topically as directed. Uses every other day as a preventive for fungus  Under breasts and abdominal area     . fish oil-omega-3 fatty acids 1000 MG capsule Take 1 g by mouth daily.     . Ginger, Zingiber officinalis, (GINGER ROOT PO) Take by mouth.    . Glucosamine HCl (GLUCOSAMINE PO) Take 1 capsule by mouth 2 (two) times daily.    . hydrochlorothiazide (HYDRODIURIL) 25 MG tablet TAKE 1 TABLET  BY MOUTH DAILY. 90 tablet 0  . HYDROcodone-acetaminophen (NORCO/VICODIN) 5-325 MG per tablet Take 0.5 tablets by mouth every 6 (six) hours as needed for moderate pain.    [name] loratadine (CLARITIN) 10 MG tablet Take 10 mg by mouth daily.    [name] LORazepam (ATIVAN) 1 MG tablet Take 0.5 mg by mouth at bedtime. scheduled    . metoprolol succinate (TOPROL-XL) 50 MG 24 hr tablet TAKE 1&1/2 TABLETS BY MOUTH ONCE DAILY. 135 tablet 3  . Multiple Vitamin (MULTIVITAMIN) tablet Take 1 tablet by mouth daily.    [name] olmesartan-hydrochlorothiazide (BENICAR HCT) 40-25 MG tablet TAKE 1 TABLET ONCE DAILY. 90 tablet 0  . PARoxetine (PAXIL) 40 MG tablet Take 40 mg by mouth daily before breakfast.     . potassium chloride SA (K-DUR,KLOR-CON) 20 MEQ tablet TAKE 1 TABLET BY MOUTH TWICE DAILY. 180 tablet 3  . TURMERIC PO Take 1 capsule by mouth daily.     . vitamin C (ASCORBIC ACID) 500 MG tablet Take 1,000 mg by mouth daily.      Current Facility-Administered Medications  Medication Dose Route Frequency Provider Last Rate Last Dose  . 0.9 %  sodium chloride infusion  500 mL Intravenous Once [name], [name], MD        Allergies:   Ciprocin-fluocin-procin [fluocinolone]; Citric acid; Cleocin [clindamycin hcl]; Cranberry; Iodine; and Septra [sulfamethoxazole-trimethoprim]   Social History:  The patient  reports that [name] has never smoked. [name] has never used smokeless tobacco. [name] reports that [name] does not drink alcohol or use drugs.   Family History:  The patient's family history includes Colon cancer (age of onset: 66) in her mother; Congestive Heart Failure in her father; Esophageal cancer in her paternal uncle; Lung cancer in her mother; Pancreatic cancer in her father; Skin cancer in her sister.    ROS:  Please see the history of present illness.   Otherwise, review of systems is positive for fatigue, palpitations, shortness of breath, anxiety, depression.   All other systems are reviewed and negative.    PHYSICAL EXAM: VS:  BP 124/80   Pulse 68   Ht _0  (1.651 m)   Wt 197 lb (89.4 kg)   SpO2 97%   BMI 32.78 kg/m  , BMI Body mass index is 32.78 kg/m. GEN: Well nourished, well developed, in no acute distress  HEENT: normal  Neck: no JVD, carotid bruits, or masses Cardiac: RRR; no murmurs, rubs, or gallops,no edema  Respiratory:  clear to auscultation bilaterally, normal work of breathing GI: soft, nontender, nondistended, + BS MS: no deformity or atrophy  Skin: warm and dry Neuro:  Strength and sensation are intact Psych: euthymic mood, full affect  EKG:  EKG is ordered today. Personal review of the ekg ordered shows rhythm, right bundle branch block, PVCs  Recent Labs: No results found for requested labs within last 8760 hours.    Lipid Panel  No results found for: CHOL, TRIG, HDL, CHOLHDL, VLDL, LDLCALC,  LDLDIRECT   Wt Readings from Last 3 Encounters:  03/16/18 197 lb (89.4 kg)  01/25/18 199 lb (90.3 kg)  12/21/17 199 lb (90.3 kg)      Other studies Reviewed: Additional studies/ records that were reviewed today include: TTE 01/25/18  Review of the above records today demonstrates:  - Left ventricle: The cavity size was normal. Wall thickness was   normal. Systolic function was normal. The estimated ejection   fraction was in the range of 50% to 55%. Wall motion was normal;  there were no regional wall motion abnormalities. - Mitral valve: There was mild regurgitation.  SPECT 01/25/18  Nuclear stress EF: 57%.  The left ventricular ejection fraction is normal (55-65%).  There was no ST segment deviation noted during stress.  The study is normal.  This is a low risk study.   Holter 01/06/18 - personally reviewed  Sinus bradycardia, normal sinus rhythm and sinus tachycardia. The average heart rate is 73bpm. The heart rate ranged from 50 to 107bpm.  Frequent PVCs, bigeminal PVCs with PVC load 15.19%.  Nonsustained atrial tachycardia up to 20 beats, frequent PACs and bigeminal PACs.  Sustained SVT at 107bpm   ASSESSMENT AND PLAN:  1.  PVCs: 15% noted on cardiac monitor.  PVCs are in a right bundle branch morphology.  Appears to be coming from the LV mid septum.  PVCs are quite narrow.  Due to that, I would be hesitant to ablate in that area due to risk of conduction system disease.  [name] already has a right bundle branch block, so if damage was done to the left bundle, [name] would likely result in complete heart block.  I will thus start her on low-dose flecainide.  I will get a 24-hour monitor in 3 months to assess PVC burden.  2.  Hypertension: Well-controlled.  No changes per primary cardiology.    Current medicines are reviewed at length with the patient today.   The patient does not have concerns regarding her medicines.  The following changes were made today:   none  Labs/ tests ordered today include:  Orders Placed This Encounter  Procedures  . EKG 12-Lead     Disposition:   FU with Will Camnitz 3 months  Signed, Will [name], MD  03/16/2018 3:40 PM     [place] [place] [address] [place] [address] [phone] (office) [phone] (fax)

## 2018-03-23 ENCOUNTER — Ambulatory Visit: Payer: Medicare Other

## 2018-03-29 ENCOUNTER — Ambulatory Visit (INDEPENDENT_AMBULATORY_CARE_PROVIDER_SITE_OTHER): Payer: Medicare Other | Admitting: *Deleted

## 2018-03-29 VITALS — HR 61 | Ht 65.0 in | Wt 197.0 lb

## 2018-03-29 DIAGNOSIS — I493 Ventricular premature depolarization: Secondary | ICD-10-CM | POA: Diagnosis not present

## 2018-03-29 DIAGNOSIS — Z79899 Other long term (current) drug therapy: Secondary | ICD-10-CM | POA: Diagnosis not present

## 2018-03-29 NOTE — Progress Notes (Signed)
1.  Reason for visit: EKG post Flecainide start  2.  Name of MD requesting visit:  Camnitz  3. H&P:  See epic  4.  ROS related to problem:    5.  Assessment and plan per MD:   EKG showing SR w/ 1AVB, HR 61. Reviewed with Dr. Angelena Form, DOD, no orders received.

## 2018-05-01 ENCOUNTER — Other Ambulatory Visit: Payer: Medicare Other

## 2018-05-21 ENCOUNTER — Telehealth: Payer: Self-pay | Admitting: *Deleted

## 2018-05-21 NOTE — Telephone Encounter (Signed)
Cancelling 05/21/18 appointment to have 24 hour holter applied.  We will enroll patient to have a 3 day long term (holter)monitor shipped directly to the house. (24 hour holters not available for shipment). Instructions in kit, monitor company available 24/7 for questions.  Patient enrolled for [employer] to ship a 3 day ZIO XT ;long term (holter) monitor to the patients home.

## 2018-05-29 ENCOUNTER — Telehealth: Payer: Self-pay | Admitting: *Deleted

## 2018-05-29 NOTE — Telephone Encounter (Signed)
Called patient to let them know due to recent COVID19 CDC and Health Department Protocols, we are not seeing patients in the office. We are instead seeing if they would like to schedule this appointment as a Virtual Appointment VIA Smartphone or Laptop. Unable to reach patient. LVMTCB  

## 2018-06-04 ENCOUNTER — Other Ambulatory Visit: Payer: Self-pay | Admitting: Cardiology

## 2018-06-05 ENCOUNTER — Ambulatory Visit: Payer: Medicare Other | Admitting: Cardiology

## 2018-07-03 ENCOUNTER — Other Ambulatory Visit: Payer: Self-pay | Admitting: Cardiology

## 2018-07-21 ENCOUNTER — Ambulatory Visit (INDEPENDENT_AMBULATORY_CARE_PROVIDER_SITE_OTHER): Payer: Medicare Other

## 2018-07-21 DIAGNOSIS — I493 Ventricular premature depolarization: Secondary | ICD-10-CM

## 2018-07-31 ENCOUNTER — Telehealth: Payer: Self-pay | Admitting: *Deleted

## 2018-07-31 NOTE — Telephone Encounter (Signed)
Follow up  ° ° °Patient is returning call.  °

## 2018-07-31 NOTE — Telephone Encounter (Signed)
Calling patient today to discuss upcoming appointment.  We are currently trying to limit exposure to the virus that causes COVID-19 by seeing patients at home rather than in the office. We would like to schedule this appointment as a Virtual Appointment VIA Smartphone or Laptop. Unable to reach patient.  LVMTCB  

## 2018-08-01 NOTE — Telephone Encounter (Signed)
Patient called again, she stated she hasn't received a call back from yesterday.

## 2018-08-01 NOTE — Telephone Encounter (Signed)
Advised the patient that Dr. Macky Lower nurse was off and would contact her at a later time.

## 2018-08-02 ENCOUNTER — Other Ambulatory Visit: Payer: Self-pay

## 2018-08-02 NOTE — Telephone Encounter (Signed)
LMTCB

## 2018-08-07 ENCOUNTER — Telehealth (INDEPENDENT_AMBULATORY_CARE_PROVIDER_SITE_OTHER): Payer: Medicare Other | Admitting: Cardiology

## 2018-08-07 DIAGNOSIS — I493 Ventricular premature depolarization: Secondary | ICD-10-CM | POA: Diagnosis not present

## 2018-08-07 NOTE — Progress Notes (Signed)
Electrophysiology TeleHealth Note   Due to national recommendations of social distancing due to COVID 19, an audio/video telehealth visit is felt to be most appropriate for this patient at this time.  See Epic message for the patient's consent to telehealth for [facility].   Date:  08/07/2018   ID:  [name], DOB [birth year], MRN [id number]  Location: patient's home  Provider location: [address] [place]  Evaluation Performed: Follow-up visit  PCP:  [name], MD  Cardiologist: [name] Electrophysiologist:  Dr Curt Bears  Chief Complaint: PVCs  History of Present Illness:    [name] is a 78 y.o. female who presents via audio/video conferencing for a telehealth visit today.  Since last being seen in our clinic, the patient reports doing very well.  Today, she denies symptoms of palpitations, chest pain, shortness of breath,  lower extremity edema, dizziness, presyncope, or syncope.  The patient is otherwise without complaint today.  The patient denies symptoms of fevers, chills, cough, or new SOB worrisome for COVID 19.  She has a history significant for hypertension, right bundle branch block, PVCs with a burden of 15%.She was started on low-dose flecainide at her last visit.  Today, denies symptoms of palpitations, chest pain, shortness of breath, orthopnea, PND, lower extremity edema, claudication, dizziness, presyncope, syncope, bleeding, or neurologic sequela. The patient is tolerating medications without difficulties.  She is overall feeling well.  She is not aware of any PVCs at this point.  Past Medical History:  Diagnosis Date  . Allergy   . Anemia    hx of years ago   . Anxiety   . Arthritis   . Blood transfusion without reported diagnosis    after hip replacement 2013  . Cataract    removed both eyes  . Chronic kidney disease    hx of uti   . Colon polyp   . Depression   . DJD (degenerative joint disease)   . GERD  (gastroesophageal reflux disease)   . Hypertension   . Lumbar radiculopathy    S/P ESI- Dr Maxie Better Dr. Nelva Bush  . Neuromuscular disorder (HCC)    numbness in middle toes on left leg   . Osteoporosis   . Pneumonia    hx of   . RBBB   . Seasonal allergies   . Syncope and collapse    in the setting of diarrheal illness-Dr turner    Past Surgical History:  Procedure Laterality Date  . BACK SURGERY  2006  . CARDIAC CATHETERIZATION  2003   Normal coronary arteries  . COLONOSCOPY    . POLYPECTOMY    . [place]  . TOTAL HIP ARTHROPLASTY  2008   right  . TOTAL HIP ARTHROPLASTY  12/16/2011   Procedure: TOTAL HIP ARTHROPLASTY;  Surgeon: [name], MD;  Location: [facility];  Service: Orthopedics;  Laterality: Left;  [name] VAGINAL HYSTERECTOMY  1981  . WISDOM TOOTH EXTRACTION      Current Outpatient Medications  Medication Sig Dispense Refill  . b complex vitamins capsule Take 1 capsule by mouth daily.    . Calcium Carbonate-Vitamin D (CALCIUM-D PO) Take 1,200 mg by mouth daily.     . cephALEXin (KEFLEX) 250 MG capsule Take 250 mg by mouth daily.     [name] conjugated estrogens (PREMARIN) vaginal cream Place 1 Applicatorful vaginally once a week.     [name] Desonide Crea-Moisturizing Lot 0.05 % KIT Apply 1 Dose topically as directed.  Uses every other day as a preventive for fungus  Under breasts and abdominal area     . fish oil-omega-3 fatty acids 1000 MG capsule Take 1 g by mouth daily.     . flecainide (TAMBOCOR) 50 MG tablet TAKE 1 TABLET BY MOUTH TWICE DAILY. 180 tablet 1  . Ginger, Zingiber officinalis, (GINGER ROOT PO) Take by mouth.    . Glucosamine HCl (GLUCOSAMINE PO) Take 1 capsule by mouth 2 (two) times daily.    . hydrochlorothiazide (HYDRODIURIL) 25 MG tablet TAKE 1 TABLET BY MOUTH DAILY. 90 tablet 0  . HYDROcodone-acetaminophen (NORCO/VICODIN) 5-325 MG per tablet Take 0.5 tablets by mouth every 6 (six) hours as needed for moderate pain.    [name] loratadine (CLARITIN) 10 MG  tablet Take 10 mg by mouth daily.    [name] LORazepam (ATIVAN) 1 MG tablet Take 0.5 mg by mouth at bedtime. scheduled    . metoprolol succinate (TOPROL-XL) 50 MG 24 hr tablet TAKE 1&1/2 TABLETS BY MOUTH ONCE DAILY. 135 tablet 3  . Multiple Vitamin (MULTIVITAMIN) tablet Take 1 tablet by mouth daily.    [name] olmesartan-hydrochlorothiazide (BENICAR HCT) 40-25 MG tablet TAKE 1 TABLET ONCE DAILY. 90 tablet 2  . PARoxetine (PAXIL) 40 MG tablet Take 40 mg by mouth daily before breakfast.     . potassium chloride SA (K-DUR,KLOR-CON) 20 MEQ tablet TAKE 1 TABLET BY MOUTH TWICE DAILY. 180 tablet 3  . TURMERIC PO Take 1 capsule by mouth daily.    . vitamin C (ASCORBIC ACID) 500 MG tablet Take 1,000 mg by mouth daily.      Current Facility-Administered Medications  Medication Dose Route Frequency Provider Last Rate Last Dose  . 0.9 %  sodium chloride infusion  500 mL Intravenous Once [name], [name], MD        Allergies:   Ciprocin-fluocin-procin [fluocinolone], Citric acid, Cleocin [clindamycin hcl], Cranberry, Iodine, and Septra [sulfamethoxazole-trimethoprim]   Social History:  The patient  reports that she has never smoked. She has never used smokeless tobacco. She reports that she does not drink alcohol or use drugs.   Family History:  The patient's  family history includes Colon cancer (age of onset: 29) in her mother; Congestive Heart Failure in her father; Esophageal cancer in her paternal uncle; Lung cancer in her mother; Pancreatic cancer in her father; Skin cancer in her sister.   ROS:  Please see the history of present illness.   All other systems are personally reviewed and negative.    Exam:    Vital Signs:  There were no vitals taken for this visit.  Over the phone, no acute distress, no shortness of breath.  Labs/Other Tests and Data Reviewed:    Recent Labs: No results found for requested labs within last 8760 hours.   Wt Readings from Last 3 Encounters:  03/29/18 197 lb (89.4 kg)   03/16/18 197 lb (89.4 kg)  01/25/18 199 lb (90.3 kg)     Other studies personally reviewed: Additional studies/ records that were reviewed today include: 24-hour monitor 05/03/2018 personally reviewed  Review of the above records today demonstrates:   Max 114 bpm 03:23pm, 06/05 Min 45 bpm 08:59am, 06/05 Avg 54 bpm <1% PVCs and PACs Sinus rhythm with episodes of sinus tachycardia and SVT 23 supraventricular runs longest 8 beats at 114 bpm   ASSESSMENT & PLAN:    1.  PVCs: Burden of 15% on cardiac monitor.  Her PVCs are right bundle branch morphology which are quite narrow.  She  was a started on low-dose flecainide.  Fortunately her PVC burden is down to less than 1%.  We Layna Roeper make no medication changes.  She is feeling well.  2.  Hypertension: Currently well controlled   COVID 19 screen The patient denies symptoms of COVID 19 at this time.  The importance of social distancing was discussed today.  Follow-up: 6 months  Current medicines are reviewed at length with the patient today.   The patient does not have concerns regarding her medicines.  The following changes were made today:  none  Labs/ tests ordered today include:  No orders of the defined types were placed in this encounter.    Patient Risk:  after full review of this patients clinical status, I feel that they are at moderate risk at this time.  Today, I have spent 6 minutes with the patient with telehealth technology discussing PVCs.    Signed, Ngoc Daughtridge [name], MD  08/07/2018 10:48 AM     [place] [address] [phone] (office) [phone] (fax)

## 2018-12-11 ENCOUNTER — Other Ambulatory Visit: Payer: Self-pay | Admitting: Cardiology

## 2019-01-31 ENCOUNTER — Other Ambulatory Visit: Payer: Self-pay | Admitting: Cardiology

## 2019-03-04 ENCOUNTER — Other Ambulatory Visit: Payer: Self-pay | Admitting: Cardiology

## 2019-03-26 ENCOUNTER — Ambulatory Visit: Payer: Medicare Other | Admitting: Cardiology

## 2019-05-17 ENCOUNTER — Other Ambulatory Visit: Payer: Self-pay | Admitting: Cardiology

## 2019-05-30 ENCOUNTER — Encounter: Payer: Self-pay | Admitting: Cardiology

## 2019-05-30 ENCOUNTER — Ambulatory Visit: Payer: Medicare PPO | Admitting: Cardiology

## 2019-05-30 ENCOUNTER — Other Ambulatory Visit: Payer: Self-pay

## 2019-05-30 VITALS — BP 132/74 | HR 59 | Ht 65.0 in | Wt 195.0 lb

## 2019-05-30 DIAGNOSIS — I493 Ventricular premature depolarization: Secondary | ICD-10-CM

## 2019-05-30 NOTE — Progress Notes (Signed)
Electrophysiology Office Note   Date:  05/30/2019   ID:  [name], [name] Nov 24, 1940, MRN [id number]  PCP:  [name], MD  Cardiologist:  [name] Primary Electrophysiologist:  Niki Payment [name], MD    No chief complaint on file.    History of Present Illness: [name] is a 79 y.o. female who is being seen today for the evaluation of PVCs at the request of [name]. Presenting today for electrophysiology evaluation.  History of hypertension right bundle branch block.  She was noted to have PVCs on her EKG and thus a 24-hour monitor was placed showing 15% PVCs.  She was put on flecainide with a repeat monitor showing a PVC burden of less than 1%.  Today, denies symptoms of palpitations, chest pain, shortness of breath, orthopnea, PND, lower extremity edema, claudication, dizziness, presyncope, syncope, bleeding, or neurologic sequela. The patient is tolerating medications without difficulties.  Overall she is doing well.  She is a little bit more short of breath, but she feels that this is due to lack of exercise during Covid.  She has no other complaints.  She is able do all of her daily activities without restriction.   Past Medical History:  Diagnosis Date  . Allergy   . Anemia    hx of years ago   . Anxiety   . Arthritis   . Blood transfusion without reported diagnosis    after hip replacement 2013  . Cataract    removed both eyes  . Chronic kidney disease    hx of uti   . Colon polyp   . Depression   . DJD (degenerative joint disease)   . GERD (gastroesophageal reflux disease)   . Hypertension   . Lumbar radiculopathy    S/P ESI- Dr Maxie Better Dr. Nelva Bush  . Neuromuscular disorder (HCC)    numbness in middle toes on left leg   . Osteoporosis   . Pneumonia    hx of   . RBBB   . Seasonal allergies   . Syncope and collapse    in the setting of diarrheal illness-Dr turner   Past Surgical History:  Procedure Laterality Date  . BACK SURGERY  2006  .  CARDIAC CATHETERIZATION  2003   Normal coronary arteries  . COLONOSCOPY    . POLYPECTOMY    . [place]  . TOTAL HIP ARTHROPLASTY  2008   right  . TOTAL HIP ARTHROPLASTY  12/16/2011   Procedure: TOTAL HIP ARTHROPLASTY;  Surgeon: [name], MD;  Location: [facility];  Service: Orthopedics;  Laterality: Left;  [name] VAGINAL HYSTERECTOMY  1981  . WISDOM TOOTH EXTRACTION       Current Outpatient Medications  Medication Sig Dispense Refill  . b complex vitamins capsule Take 1 capsule by mouth daily.    . Calcium Carbonate-Vitamin D (CALCIUM-D PO) Take 1,200 mg by mouth daily.     . cephALEXin (KEFLEX) 250 MG capsule Take 250 mg by mouth daily.     [name] conjugated estrogens (PREMARIN) vaginal cream Place 1 Applicatorful vaginally once a week.     [name] Desonide Crea-Moisturizing Lot 0.05 % KIT Apply 1 Dose topically as directed. Uses every other day as a preventive for fungus  Under breasts and abdominal area     . fish oil-omega-3 fatty acids 1000 MG capsule Take 1 g by mouth daily.     . flecainide (TAMBOCOR) 50 MG tablet Take 1 tablet (50 mg total) by mouth 2 (  two) times daily. Please keep upcoming appt with Dr. Baird Kay in April for future refills. Thank you 60 tablet 2  . Ginger, Zingiber officinalis, (GINGER ROOT PO) Take by mouth.    . Glucosamine HCl (GLUCOSAMINE PO) Take 1 capsule by mouth 2 (two) times daily.    . hydrochlorothiazide (HYDRODIURIL) 25 MG tablet TAKE 1 TABLET BY MOUTH DAILY. 90 tablet 0  . HYDROcodone-acetaminophen (NORCO/VICODIN) 5-325 MG per tablet Take 0.5 tablets by mouth every 6 (six) hours as needed for moderate pain.    [name] loratadine (CLARITIN) 10 MG tablet Take 10 mg by mouth daily.    [name] LORazepam (ATIVAN) 1 MG tablet Take 0.5 mg by mouth at bedtime. scheduled    . metoprolol succinate (TOPROL-XL) 50 MG 24 hr tablet TAKE 1&1/2 TABLETS ONCE DAILY. 135 tablet 3  . Multiple Vitamin (MULTIVITAMIN) tablet Take 1 tablet by mouth daily.    [name] olmesartan (BENICAR) 40  MG tablet TAKE 1 TABLET ONCE DAILY. 90 tablet 0  . olmesartan-hydrochlorothiazide (BENICAR HCT) 40-25 MG tablet TAKE 1 TABLET ONCE DAILY. 90 tablet 2  . PARoxetine (PAXIL) 40 MG tablet Take 40 mg by mouth daily before breakfast.     . potassium chloride SA (KLOR-CON) 20 MEQ tablet Take 1 tablet (20 mEq total) by mouth 2 (two) times daily. Please make overdue appt with Dr. [name] before anymore refills. 2nd attempt 30 tablet 0  . TURMERIC PO Take 1 capsule by mouth daily.    . vitamin C (ASCORBIC ACID) 500 MG tablet Take 1,000 mg by mouth daily.      Current Facility-Administered Medications  Medication Dose Route Frequency Provider Last Rate Last Admin  . 0.9 %  sodium chloride infusion  500 mL Intravenous Once [name], [name], MD        Allergies:   Ciprocin-fluocin-procin [fluocinolone], Citric acid, Cleocin [clindamycin hcl], Cranberry, Iodine, and Septra [sulfamethoxazole-trimethoprim]   Social History:  The patient  reports that she has never smoked. She has never used smokeless tobacco. She reports that she does not drink alcohol or use drugs.   Family History:  The patient's family history includes Colon cancer (age of onset: 68) in her mother; Congestive Heart Failure in her father; Esophageal cancer in her paternal uncle; Lung cancer in her mother; Pancreatic cancer in her father; Skin cancer in her sister.  ROS:  Please see the history of present illness.   Otherwise, review of systems is positive for none.   All other systems are reviewed and negative.   PHYSICAL EXAM: VS:  BP 132/74   Pulse (!) 59   Ht 5' 5"  (1.651 m)   Wt 195 lb (88.5 kg)   SpO2 94%   BMI 32.45 kg/m  , BMI Body mass index is 32.45 kg/m. GEN: Well nourished, well developed, in no acute distress  HEENT: normal  Neck: no JVD, carotid bruits, or masses Cardiac: RRR; no murmurs, rubs, or gallops,no edema  Respiratory:  clear to auscultation bilaterally, normal work of breathing GI: soft, nontender,  nondistended, + BS MS: no deformity or atrophy  Skin: warm and dry Neuro:  Strength and sensation are intact Psych: euthymic mood, full affect  EKG:  EKG is ordered today. Personal review of the ekg ordered shows sinus rhythm, right bundle branch block, first-degree AV block, rate 59  Recent Labs: No results found for requested labs within last 8760 hours.    Lipid Panel  No results found for: CHOL, TRIG, HDL, CHOLHDL, VLDL, LDLCALC, LDLDIRECT  Wt Readings from Last 3 Encounters:  05/30/19 195 lb (88.5 kg)  03/29/18 197 lb (89.4 kg)  03/16/18 197 lb (89.4 kg)      Other studies Reviewed: Additional studies/ records that were reviewed today include: TTE 01/25/18  Review of the above records today demonstrates:  - Left ventricle: The cavity size was normal. Wall thickness was   normal. Systolic function was normal. The estimated ejection   fraction was in the range of 50% to 55%. Wall motion was normal;   there were no regional wall motion abnormalities. - Mitral valve: There was mild regurgitation.  SPECT 01/25/18  Nuclear stress EF: 57%.  The left ventricular ejection fraction is normal (55-65%).  There was no ST segment deviation noted during stress.  The study is normal.  This is a low risk study.   Cardiac monitor 08/03/2018 personally reviewed Max 114 bpm 03:23pm, 06/05 Min 45 bpm 08:59am, 06/05 Avg 54 bpm <1% PVCs and PACs Sinus rhythm with episodes of sinus tachycardia and SVT 23 supraventricular runs longest 8 beats at 114 bpm  ASSESSMENT AND PLAN:  1.  PVCs: 15% noted on cardiac monitor.  PVCs appear to be coming from the LV septum with a right bundle branch morphology.  PVCs are quite narrow and thus could damage the conduction system if ablation was performed.  She is currently on flecainide which has had a reduction of PVC burden to less than 1%.  Continues to feel well without palpitations.  No changes.  2.  Hypertension: Currently well  controlled    Current medicines are reviewed at length with the patient today.   The patient does not have concerns regarding her medicines.  The following changes were made today: None  Labs/ tests ordered today include:  Orders Placed This Encounter  Procedures  . EKG 12-Lead     Disposition:   FU with [place] 12 months  Signed, Brogan Martis [name], MD  05/30/2019 11:58 AM     [facility] [address] [place] [address] [phone] (office) [phone] (fax)

## 2019-06-10 ENCOUNTER — Other Ambulatory Visit: Payer: Self-pay | Admitting: Cardiology

## 2019-06-14 ENCOUNTER — Telehealth: Payer: Self-pay

## 2019-06-14 ENCOUNTER — Other Ambulatory Visit: Payer: Self-pay | Admitting: Cardiology

## 2019-06-14 MED ORDER — OLMESARTAN MEDOXOMIL 40 MG PO TABS: 40.0000 mg | ORAL_TABLET | Freq: Every day | ORAL | 0 refills | Status: DC

## 2019-06-14 NOTE — Telephone Encounter (Signed)
Pt calling stating that Dr. Curt Bears told her that she did not have to see Dr. [name], her primary Cardiologist anymore and that Dr. Curt Bears would take over all of her heart medications. Pt would like a call back concerning this matter. Please address

## 2019-06-14 NOTE — Telephone Encounter (Signed)
lmtcb

## 2019-06-21 NOTE — Telephone Encounter (Signed)
lmtcb

## 2019-06-26 DIAGNOSIS — M5417 Radiculopathy, lumbosacral region: Secondary | ICD-10-CM | POA: Diagnosis not present

## 2019-06-26 DIAGNOSIS — M545 Low back pain: Secondary | ICD-10-CM | POA: Diagnosis not present

## 2019-06-26 DIAGNOSIS — M5136 Other intervertebral disc degeneration, lumbar region: Secondary | ICD-10-CM | POA: Insufficient documentation

## 2019-07-04 ENCOUNTER — Other Ambulatory Visit: Payer: Self-pay | Admitting: Cardiology

## 2019-08-14 ENCOUNTER — Telehealth: Payer: Self-pay | Admitting: Cardiology

## 2019-08-14 NOTE — Telephone Encounter (Signed)
Called and spoke to patient. She states that for the past month she has felt her breath in her throat between her breast and feels like her breath gets stuck. She states that this is worse with activity. She denies chest pain, LE swelling, weight gain, or any other Sx. She admits to eating canned soup everyday. She takes HCTZ 25 mg QD. She had a normal echo and stress test in 2019. Monitor that showed PVCs with decreased burden in 2020. Taking Flecainide 50 mg BID. Denies palpitations or skipped beats. Instructed the patient to avoid salt in her diet and continue to monitor and let us know if her Sx change or worsen. She verbalized understanding and thanked me for the call.

## 2019-08-14 NOTE — Telephone Encounter (Signed)
New message   Pt c/o Shortness Of Breath: STAT if SOB developed within the last 24 hours or pt is noticeably SOB on the phone  1. Are you currently SOB (can you hear that pt is SOB on the phone)? Yes   2. How long have you been experiencing SOB? For about a month   3. Are you SOB when sitting or when up moving around?moving around   4. Are you currently experiencing any other symptoms? No

## 2019-08-16 ENCOUNTER — Other Ambulatory Visit: Payer: Self-pay | Admitting: Cardiology

## 2019-10-02 DIAGNOSIS — N898 Other specified noninflammatory disorders of vagina: Secondary | ICD-10-CM | POA: Diagnosis not present

## 2019-10-02 DIAGNOSIS — L304 Erythema intertrigo: Secondary | ICD-10-CM | POA: Diagnosis not present

## 2019-10-02 DIAGNOSIS — R350 Frequency of micturition: Secondary | ICD-10-CM | POA: Diagnosis not present

## 2019-11-14 DIAGNOSIS — B372 Candidiasis of skin and nail: Secondary | ICD-10-CM | POA: Diagnosis not present

## 2019-12-09 ENCOUNTER — Other Ambulatory Visit: Payer: Self-pay | Admitting: Cardiology

## 2019-12-24 ENCOUNTER — Telehealth: Payer: Self-pay | Admitting: Cardiology

## 2019-12-24 NOTE — Telephone Encounter (Signed)
Pt c/o medication issue:  1. Name of Medication: flecainide (TAMBOCOR) 50 MG tablet  2. How are you currently taking this medication (dosage and times per day)? As directed  3. Are you having a reaction (difficulty breathing--STAT)? yes  4. What is your medication issue? Patient believes that this medication is giving her SOB. States it is not something that she thinks she needs to be seen for. States the feeling is in her throat but she does not feel it all of the time.    Pt c/o Shortness Of Breath: STAT if SOB developed within the last 24 hours or pt is noticeably SOB on the phone  1. Are you currently SOB (can you hear that pt is SOB on the phone)? no  2. How long have you been experiencing SOB? Ever since she started flecainide.   3. Are you SOB when sitting or when up moving around? Both   4. Are you currently experiencing any other symptoms? No

## 2019-12-24 NOTE — Telephone Encounter (Signed)
Pt reports SOB for about the past year (really not sure). Reports that she did see PCP over the summer, but can't remember if she discussed the SOB. Pt aware that best to see Dr. Curt Bears to further evaluate/discuss.  Informed that she has been on this medication for almost 2 years, and most likely not related but should discuss further (discussed w/ Camnitz). Pt scheduled to see Camnitz next week to further discuss. She is agreeable to plan.

## 2019-12-31 ENCOUNTER — Other Ambulatory Visit: Payer: Self-pay

## 2019-12-31 ENCOUNTER — Ambulatory Visit: Payer: Medicare PPO | Admitting: Cardiology

## 2019-12-31 ENCOUNTER — Encounter: Payer: Self-pay | Admitting: Cardiology

## 2019-12-31 VITALS — BP 136/82 | HR 54 | Ht 65.5 in | Wt 190.6 lb

## 2019-12-31 DIAGNOSIS — I493 Ventricular premature depolarization: Secondary | ICD-10-CM

## 2019-12-31 MED ORDER — METOPROLOL SUCCINATE ER 50 MG PO TB24: 50.0000 mg | ORAL_TABLET | Freq: Every day | ORAL | 2 refills | Status: DC

## 2019-12-31 NOTE — Patient Instructions (Addendum)
Medication Instructions:  Your physician has recommended you make the following change in your medication:  1. DECREASE Metoprolol Succinate to 50 mg once daily at bedtime  *If you need a refill on your cardiac medications before your next appointment, please call your pharmacy*   Lab Work: None ordered   Testing/Procedures: None ordered   Follow-Up: At [facility], you and your health needs are our priority.  As part of our continuing mission to provide you with exceptional heart care, we have created designated Provider Care Teams.  These Care Teams include your primary Cardiologist (physician) and Advanced Practice Providers (APPs -  Physician Assistants and Nurse Practitioners) who all work together to provide you with the care you need, when you need it.  Your next appointment:   6 month(s)  The format for your next appointment:   In Person  Provider:   Allegra Lai, MD    Thank you for choosing [facility]!!   Trinidad Curet, RN [phone]

## 2019-12-31 NOTE — Progress Notes (Signed)
Electrophysiology Office Note   Date:  12/31/2019   ID:  [name], [name] 10-07-1940, MRN [id number]  PCP:  [name], MD  Cardiologist:  [name] Primary Electrophysiologist:  [name] Sydney Leeds, MD    No chief complaint on file.    History of Present Illness: [name] is a 79 y.o. female who is being seen today for the evaluation of PVCs at the request of [name]. Presenting today for electrophysiology evaluation.    She has a history of hypertension and right bundle branch block.  She was noted to have PVCs and wore a 24-hour monitor that showed a 15% burden.  She was put on flecainide with a reduction in her burden to less than 1%.   Today, denies symptoms of palpitations, chest pain, shortness of breath, orthopnea, PND, lower extremity edema, claudication, dizziness, presyncope, syncope, bleeding, or neurologic sequela. The patient is tolerating medications without difficulties.  Since last being seen she has overall done well.  She has noted no further palpitations.  She does complain of intermittent shortness of breath.  This mainly occurs when she is exerting herself.  It does not occur when she is at rest.  She currently takes Toprol-XL and her heart rate is 50s in clinic today.   Past Medical History:  Diagnosis Date  . Allergy   . Anemia    hx of years ago   . Anxiety   . Arthritis   . Blood transfusion without reported diagnosis    after hip replacement 2013  . Cataract    removed both eyes  . Chronic kidney disease    hx of uti   . Colon polyp   . Depression   . DJD (degenerative joint disease)   . GERD (gastroesophageal reflux disease)   . Hypertension   . Lumbar radiculopathy    S/P ESI- Dr [name] Dr. Nelva Rangel  . Neuromuscular disorder (HCC)    numbness in middle toes on left leg   . Osteoporosis   . Pneumonia    hx of   . RBBB   . Seasonal allergies   . Syncope and collapse    in the setting of diarrheal illness-Dr turner   Past  Surgical History:  Procedure Laterality Date  . BACK SURGERY  2006  . CARDIAC CATHETERIZATION  2003   Normal coronary arteries  . COLONOSCOPY    . POLYPECTOMY    . [place]  . TOTAL HIP ARTHROPLASTY  2008   right  . TOTAL HIP ARTHROPLASTY  12/16/2011   Procedure: TOTAL HIP ARTHROPLASTY;  Surgeon: Sydney Hai, MD;  Location: [facility];  Service: Orthopedics;  Laterality: Left;  [name] VAGINAL HYSTERECTOMY  1981  . WISDOM TOOTH EXTRACTION       Current Outpatient Medications  Medication Sig Dispense Refill  . b complex vitamins capsule Take 1 capsule by mouth daily.    . Calcium Carbonate-Vitamin D (CALCIUM-D PO) Take 1,200 mg by mouth daily.     . cephALEXin (KEFLEX) 250 MG capsule Take 250 mg by mouth daily.     [name] conjugated estrogens (PREMARIN) vaginal cream Place 1 Applicatorful vaginally once a week.     [name] Desonide Crea-Moisturizing Lot 0.05 % KIT Apply 1 Dose topically as directed. Uses every other day as a preventive for fungus  Under breasts and abdominal area     . fish oil-omega-3 fatty acids 1000 MG capsule Take 1 g by mouth daily.     [name]  flecainide (TAMBOCOR) 50 MG tablet TAKE 1 TABLET BY MOUTH TWICE DAILY. 60 tablet 9  . Ginger, Zingiber officinalis, (GINGER ROOT PO) Take by mouth.    . Glucosamine HCl (GLUCOSAMINE PO) Take 1 capsule by mouth 2 (two) times daily.    . hydrochlorothiazide (HYDRODIURIL) 25 MG tablet TAKE 1 TABLET BY MOUTH DAILY. 90 tablet 3  . HYDROcodone-acetaminophen (NORCO/VICODIN) 5-325 MG per tablet Take 0.5 tablets by mouth every 6 (six) hours as needed for moderate pain.    [name] loratadine (CLARITIN) 10 MG tablet Take 10 mg by mouth daily.    [name] LORazepam (ATIVAN) 1 MG tablet Take 0.5 mg by mouth at bedtime. scheduled    . Multiple Vitamin (MULTIVITAMIN) tablet Take 1 tablet by mouth daily.    [name] olmesartan (BENICAR) 40 MG tablet TAKE 1 TABLET ONCE DAILY. 90 tablet 3  . PARoxetine (PAXIL) 40 MG tablet Take 40 mg by mouth daily before breakfast.      . potassium chloride SA (KLOR-CON) 20 MEQ tablet TAKE 1 TABLET BY MOUTH TWICE DAILY. 90 tablet 3  . TURMERIC PO Take 1 capsule by mouth daily.    . vitamin C (ASCORBIC ACID) 500 MG tablet Take 1,000 mg by mouth daily.      Current Facility-Administered Medications  Medication Dose Route Frequency Provider Last Rate Last Admin  . 0.9 %  sodium chloride infusion  500 mL Intravenous Once [name], Sydney Minks, MD        Allergies:   Ciprocin-fluocin-procin [fluocinolone], Citric acid, Cleocin [clindamycin hcl], Cranberry, Iodine, and Septra [sulfamethoxazole-trimethoprim]   Social History:  The patient  reports that she has never smoked. She has never used smokeless tobacco. She reports that she does not drink alcohol and does not use drugs.   Family History:  The patient's family history includes Colon cancer (age of onset: 45) in her mother; Congestive Heart Failure in her father; Esophageal cancer in her paternal uncle; Lung cancer in her mother; Pancreatic cancer in her father; Skin cancer in her sister.   ROS:  Please see the history of present illness.   Otherwise, review of systems is positive for none.   All other systems are reviewed and negative.   PHYSICAL EXAM: VS:  BP 136/82   Pulse (!) 54   Ht 5' 5.5" (1.664 m)   Wt 190 lb 9.6 oz (86.5 kg)   SpO2 96%   BMI 31.24 kg/m  , BMI Body mass index is 31.24 kg/m. GEN: Well nourished, well developed, in no acute distress  HEENT: normal  Neck: no JVD, carotid bruits, or masses Cardiac: RRR; no murmurs, rubs, or gallops,no edema  Respiratory:  clear to auscultation bilaterally, normal work of breathing GI: soft, nontender, nondistended, + BS MS: no deformity or atrophy  Skin: warm and dry Neuro:  Strength and sensation are intact Psych: euthymic mood, full affect  EKG:  EKG is ordered today. Personal review of the ekg ordered  shows sinus rhythm, right bundle branch block  Recent Labs: No results found for requested labs  within last 8760 hours.    Lipid Panel  No results found for: CHOL, TRIG, HDL, CHOLHDL, VLDL, LDLCALC, LDLDIRECT   Wt Readings from Last 3 Encounters:  12/31/19 190 lb 9.6 oz (86.5 kg)  05/30/19 195 lb (88.5 kg)  03/29/18 197 lb (89.4 kg)      Other studies Reviewed: Additional studies/ records that were reviewed today include: TTE 01/25/18  Review of the above records today demonstrates:  - Left  ventricle: The cavity size was normal. Wall thickness was   normal. Systolic function was normal. The estimated ejection   fraction was in the range of 50% to 55%. Wall motion was normal;   there were no regional wall motion abnormalities. - Mitral valve: There was mild regurgitation.  SPECT 01/25/18  Nuclear stress EF: 57%.  The left ventricular ejection fraction is normal (55-65%).  There was no ST segment deviation noted during stress.  The study is normal.  This is a low risk study.   Cardiac monitor 08/03/2018 personally reviewed Max 114 bpm 03:23pm, 06/05 Min 45 bpm 08:59am, 06/05 Avg 54 bpm <1% PVCs and PACs Sinus rhythm with episodes of sinus tachycardia and SVT 23 supraventricular runs longest 8 beats at 114 bpm  ASSESSMENT AND PLAN:  1.  PVCs: 15% noted on cardiac monitor.  Appear to be coming from the LV septum with a right bundle branch block morphology.  They are quite narrow and the worry is that they could damage the conduction system with ablation.  She is currently on flecainide (monitoring for high risk medication) with a significantly reduced burden of less than 1%.  Fortunately, her QRS interval remains stable on her dose.  2.  Hypertension: Currently well controlled  3.  Shortness of breath: Possibly caused by chronotropic incompetence.  Her heart rate is 53 in clinic today.  We Brittni Hult decrease her metoprolol to 50 mg to see if this improves her symptoms.    Current medicines are reviewed at length with the patient today.   The patient does not have  concerns regarding her medicines.  The following changes were made today: Decrease Toprol-XL  Labs/ tests ordered today include:  Orders Placed This Encounter  Procedures  . EKG 12-Lead     Disposition:   FU with Khylee Algeo 6 months  Signed, Isabellarose Kope Sydney Leeds, MD  12/31/2019 3:58 PM     [facility] [address] [place] [address] [phone] (office) [phone] (fax)

## 2020-02-05 DIAGNOSIS — L821 Other seborrheic keratosis: Secondary | ICD-10-CM | POA: Diagnosis not present

## 2020-02-05 DIAGNOSIS — D1801 Hemangioma of skin and subcutaneous tissue: Secondary | ICD-10-CM | POA: Diagnosis not present

## 2020-02-05 DIAGNOSIS — L710 Perioral dermatitis: Secondary | ICD-10-CM | POA: Diagnosis not present

## 2020-02-05 DIAGNOSIS — L814 Other melanin hyperpigmentation: Secondary | ICD-10-CM | POA: Diagnosis not present

## 2020-02-05 DIAGNOSIS — D229 Melanocytic nevi, unspecified: Secondary | ICD-10-CM | POA: Diagnosis not present

## 2020-04-21 DIAGNOSIS — N898 Other specified noninflammatory disorders of vagina: Secondary | ICD-10-CM | POA: Diagnosis not present

## 2020-04-21 DIAGNOSIS — R350 Frequency of micturition: Secondary | ICD-10-CM | POA: Diagnosis not present

## 2020-04-21 DIAGNOSIS — R197 Diarrhea, unspecified: Secondary | ICD-10-CM | POA: Diagnosis not present

## 2020-05-12 DIAGNOSIS — I493 Ventricular premature depolarization: Secondary | ICD-10-CM | POA: Diagnosis not present

## 2020-05-12 DIAGNOSIS — Z0000 Encounter for general adult medical examination without abnormal findings: Secondary | ICD-10-CM | POA: Diagnosis not present

## 2020-05-12 DIAGNOSIS — M5136 Other intervertebral disc degeneration, lumbar region: Secondary | ICD-10-CM | POA: Diagnosis not present

## 2020-05-12 DIAGNOSIS — Z1389 Encounter for screening for other disorder: Secondary | ICD-10-CM | POA: Diagnosis not present

## 2020-05-12 DIAGNOSIS — M169 Osteoarthritis of hip, unspecified: Secondary | ICD-10-CM | POA: Diagnosis not present

## 2020-05-12 DIAGNOSIS — F325 Major depressive disorder, single episode, in full remission: Secondary | ICD-10-CM | POA: Diagnosis not present

## 2020-05-12 DIAGNOSIS — I10 Essential (primary) hypertension: Secondary | ICD-10-CM | POA: Diagnosis not present

## 2020-05-12 DIAGNOSIS — M179 Osteoarthritis of knee, unspecified: Secondary | ICD-10-CM | POA: Diagnosis not present

## 2020-05-12 DIAGNOSIS — N390 Urinary tract infection, site not specified: Secondary | ICD-10-CM | POA: Diagnosis not present

## 2020-05-26 DIAGNOSIS — Z7189 Other specified counseling: Secondary | ICD-10-CM | POA: Diagnosis not present

## 2020-06-01 ENCOUNTER — Other Ambulatory Visit: Payer: Self-pay | Admitting: Cardiology

## 2020-06-11 ENCOUNTER — Other Ambulatory Visit: Payer: Self-pay | Admitting: Cardiology

## 2020-06-25 ENCOUNTER — Other Ambulatory Visit: Payer: Self-pay

## 2020-06-25 MED ORDER — OLMESARTAN MEDOXOMIL 40 MG PO TABS
40.0000 mg | ORAL_TABLET | Freq: Every day | ORAL | 1 refills | Status: DC
Start: 2020-06-25 — End: 2021-01-20

## 2020-06-30 DIAGNOSIS — Z79899 Other long term (current) drug therapy: Secondary | ICD-10-CM | POA: Diagnosis not present

## 2020-06-30 DIAGNOSIS — L570 Actinic keratosis: Secondary | ICD-10-CM | POA: Diagnosis not present

## 2020-06-30 DIAGNOSIS — L710 Perioral dermatitis: Secondary | ICD-10-CM | POA: Diagnosis not present

## 2020-08-03 DIAGNOSIS — L710 Perioral dermatitis: Secondary | ICD-10-CM | POA: Diagnosis not present

## 2020-09-01 ENCOUNTER — Other Ambulatory Visit: Payer: Self-pay | Admitting: Cardiology

## 2020-10-20 ENCOUNTER — Other Ambulatory Visit: Payer: Self-pay | Admitting: Cardiology

## 2020-12-01 ENCOUNTER — Other Ambulatory Visit: Payer: Self-pay | Admitting: Cardiology

## 2021-01-20 ENCOUNTER — Other Ambulatory Visit: Payer: Self-pay | Admitting: Cardiology

## 2021-02-12 ENCOUNTER — Other Ambulatory Visit: Payer: Self-pay | Admitting: Cardiology

## 2021-02-24 ENCOUNTER — Telehealth: Payer: Self-pay | Admitting: Cardiology

## 2021-02-24 MED ORDER — OLMESARTAN MEDOXOMIL 40 MG PO TABS: 40.0000 mg | ORAL_TABLET | Freq: Every day | ORAL | 1 refills | Status: DC

## 2021-02-24 NOTE — Telephone Encounter (Signed)
°*  STAT* If patient is at the pharmacy, call can be transferred to refill team.   1. Which medications need to be refilled? (please list name of each medication and dose if known) olmesartan (BENICAR) 40 MG tablet  2. Which pharmacy/location (including street and city if local pharmacy) is medication to be sent to? [place], [address]  3. Do they need a 30 day or 90 day supply? 90   Patient has appt on 06/17/21  Patien

## 2021-02-24 NOTE — Telephone Encounter (Signed)
Pt's medication was sent to pt's pharmacy as requested. Confirmation received.  °

## 2021-03-05 ENCOUNTER — Other Ambulatory Visit: Payer: Self-pay | Admitting: Cardiology

## 2021-03-29 ENCOUNTER — Other Ambulatory Visit: Payer: Self-pay | Admitting: Cardiology

## 2021-04-12 DIAGNOSIS — L821 Other seborrheic keratosis: Secondary | ICD-10-CM | POA: Diagnosis not present

## 2021-04-12 DIAGNOSIS — L814 Other melanin hyperpigmentation: Secondary | ICD-10-CM | POA: Diagnosis not present

## 2021-04-12 DIAGNOSIS — D225 Melanocytic nevi of trunk: Secondary | ICD-10-CM | POA: Diagnosis not present

## 2021-04-12 DIAGNOSIS — I8393 Asymptomatic varicose veins of bilateral lower extremities: Secondary | ICD-10-CM | POA: Diagnosis not present

## 2021-04-12 DIAGNOSIS — L710 Perioral dermatitis: Secondary | ICD-10-CM | POA: Diagnosis not present

## 2021-04-28 DIAGNOSIS — N3020 Other chronic cystitis without hematuria: Secondary | ICD-10-CM | POA: Diagnosis not present

## 2021-04-28 DIAGNOSIS — R350 Frequency of micturition: Secondary | ICD-10-CM | POA: Diagnosis not present

## 2021-06-04 DIAGNOSIS — Z1389 Encounter for screening for other disorder: Secondary | ICD-10-CM | POA: Diagnosis not present

## 2021-06-04 DIAGNOSIS — Z23 Encounter for immunization: Secondary | ICD-10-CM | POA: Diagnosis not present

## 2021-06-04 DIAGNOSIS — F325 Major depressive disorder, single episode, in full remission: Secondary | ICD-10-CM | POA: Diagnosis not present

## 2021-06-04 DIAGNOSIS — R197 Diarrhea, unspecified: Secondary | ICD-10-CM | POA: Diagnosis not present

## 2021-06-04 DIAGNOSIS — I10 Essential (primary) hypertension: Secondary | ICD-10-CM | POA: Diagnosis not present

## 2021-06-04 DIAGNOSIS — I493 Ventricular premature depolarization: Secondary | ICD-10-CM | POA: Diagnosis not present

## 2021-06-04 DIAGNOSIS — N390 Urinary tract infection, site not specified: Secondary | ICD-10-CM | POA: Diagnosis not present

## 2021-06-04 DIAGNOSIS — Z0000 Encounter for general adult medical examination without abnormal findings: Secondary | ICD-10-CM | POA: Diagnosis not present

## 2021-06-17 ENCOUNTER — Ambulatory Visit: Payer: Medicare PPO | Admitting: Cardiology

## 2021-06-17 ENCOUNTER — Encounter: Payer: Self-pay | Admitting: Cardiology

## 2021-06-17 VITALS — BP 134/84 | HR 82 | Ht 65.5 in | Wt 183.0 lb

## 2021-06-17 DIAGNOSIS — Z79899 Other long term (current) drug therapy: Secondary | ICD-10-CM | POA: Diagnosis not present

## 2021-06-17 DIAGNOSIS — I4819 Other persistent atrial fibrillation: Secondary | ICD-10-CM | POA: Diagnosis not present

## 2021-06-17 DIAGNOSIS — I493 Ventricular premature depolarization: Secondary | ICD-10-CM

## 2021-06-17 DIAGNOSIS — D6869 Other thrombophilia: Secondary | ICD-10-CM

## 2021-06-17 LAB — BASIC METABOLIC PANEL
BUN/Creatinine Ratio: 24 (ref 12–28)
BUN: 24 mg/dL (ref 8–27)
CO2: 26 mmol/L (ref 20–29)
Calcium: 9.1 mg/dL (ref 8.7–10.3)
Chloride: 100 mmol/L (ref 96–106)
Creatinine, Ser: 1.02 mg/dL — ABNORMAL HIGH (ref 0.57–1.00)
Glucose: 102 mg/dL — ABNORMAL HIGH (ref 70–99)
Potassium: 4.7 mmol/L (ref 3.5–5.2)
Sodium: 138 mmol/L (ref 134–144)
eGFR: 56 mL/min/{1.73_m2} — ABNORMAL LOW (ref >59)

## 2021-06-17 LAB — CBC
Hematocrit: 38.7 % (ref 34.0–46.6)
Hemoglobin: 13 g/dL (ref 11.1–15.9)
MCH: 31 pg (ref 26.6–33.0)
MCHC: 33.6 g/dL (ref 31.5–35.7)
MCV: 92 fL (ref 79–97)
Platelets: 288 10*3/uL (ref 150–450)
RBC: 4.19 x10E6/uL (ref 3.77–5.28)
RDW: 11.8 % (ref 11.7–15.4)
WBC: 7.1 10*3/uL (ref 3.4–10.8)

## 2021-06-17 MED ORDER — APIXABAN 5 MG PO TABS: 5.0000 mg | ORAL_TABLET | Freq: Two times a day (BID) | ORAL | 6 refills | Status: DC

## 2021-06-17 MED ORDER — METOPROLOL SUCCINATE ER 50 MG PO TB24: ORAL_TABLET | ORAL | 3 refills | Status: DC

## 2021-06-17 NOTE — Patient Instructions (Addendum)
Medication Instructions:  ?Your physician has recommended you make the following change in your medication:  ?START Eliquis 5 mg twice daily  -- DO NOT START THIS UNTIL THE NURSE SAYS ITS OK ? ?*If you need a refill on your cardiac medications before your next appointment, please call your pharmacy* ? ? ?Lab Work: ?TODAY: bmet & cbc ? ?If you have labs (blood work) drawn today and your tests are completely normal, you will receive your results only by: ?MyChart Message (if you have MyChart) OR ?A paper copy in the mail ?If you have any lab test that is abnormal or we need to change your treatment, we will call you to review the results. ? ? ?Testing/Procedures: ?None ordered ? ? ?Follow-Up: ?At [facility], you and your health needs are our priority.  As part of our continuing mission to provide you with exceptional heart care, we have created designated Provider Care Teams.  These Care Teams include your primary Cardiologist (physician) and Advanced Practice Providers (APPs -  Physician Assistants and Nurse Practitioners) who all work together to provide you with the care you need, when you need it. ? ?Your next appointment:   ?3 weeks ? ?The format for your next appointment:   ?In Person ? ?Provider:   ?You will follow up in the [facility] located at [facility]. ?Your provider will be: ?Roderic Palau, NP or Clint R. Fenton, PA-C  ? ? ?Other Instructions ? ?Important Information About Sugar ? ? ? ? ? ? ?Apixaban Tablets ?What is this medication? ?APIXABAN (a PIX a ban) prevents or treats blood clots. It is also used to lower the risk of stroke in people with AFib (atrial fibrillation). It belongs to a group of medications called blood thinners. ?This medicine may be used for other purposes; ask your health care provider or pharmacist if you have questions. ?COMMON BRAND NAME(S): Eliquis ?What should I tell my care team before I take this medication? ?They need to know if you have any of  these conditions: ?Antiphospholipid antibody syndrome ?Bleeding disorder ?History of bleeding in the brain ?History of blood clots ?History of stomach bleeding ?Kidney disease ?Liver disease ?Mechanical heart valve ?Spinal surgery ?An unusual or allergic reaction to apixaban, other medications, foods, dyes, or preservatives ?Pregnant or trying to get pregnant ?Breast-feeding ?How should I use this medication? ?Take this medication by mouth. For your therapy to work as well as possible, take each dose exactly as prescribed on the prescription label. Do not skip doses. Skipping doses or stopping this medication can increase your risk of a blood clot or stroke. Keep taking this medication unless your care team tells you to stop. Take it as directed on the prescription label at the same time every day. You can take it with or without food. If it upsets your stomach, take it with food. ?A special MedGuide will be given to you by the pharmacist with each prescription and refill. Be sure to read this information carefully each time. ?Talk to your care team about the use of this medication in children. Special care may be needed. ?Overdosage: If you think you have taken too much of this medicine contact a poison control center or emergency room at once. ?NOTE: This medicine is only for you. Do not share this medicine with others. ?What if I miss a dose? ?If you miss a dose, take it as soon as you can. If it is almost time for your next dose, take only that  dose. Do not take double or extra doses. ?What may interact with this medication? ?This medication may interact with the following: ?Aspirin and aspirin-like medications ?Certain medications for fungal infections like itraconazole and ketoconazole ?Certain medications for seizures like carbamazepine and phenytoin ?Certain medications for blood clots like enoxaparin, dalteparin, heparin, and warfarin ?Clarithromycin ?NSAIDs, medications for pain and inflammation, like  ibuprofen or naproxen ?Rifampin ?Ritonavir ?St. John's wort ?This list may not describe all possible interactions. Give your health care provider a list of all the medicines, herbs, non-prescription drugs, or dietary supplements you use. Also tell them if you smoke, drink alcohol, or use illegal drugs. Some items may interact with your medicine. ?What should I watch for while using this medication? ?Visit your healthcare professional for regular checks on your progress. You may need blood work done while you are taking this medication. Your condition will be monitored carefully while you are receiving this medication. It is important not to miss any appointments. ?Avoid sports and activities that might cause injury while you are using this medication. Severe falls or injuries can cause unseen bleeding. Be careful when using sharp tools or knives. Consider using an Copy. Take special care brushing or flossing your teeth. Report any injuries, bruising, or red spots on the skin to your healthcare professional. ?If you are going to need surgery or other procedure, tell your healthcare professional that you are taking this medication. ?Wear a medical ID bracelet or chain. Carry a card that describes your disease and details of your medication and dosage times. ?What side effects may I notice from receiving this medication? ?Side effects that you should report to your care team as soon as possible: ?Allergic reactions--skin rash, itching, hives, swelling of the face, lips, tongue, or throat ?Bleeding--bloody or black, tar-like stools, vomiting blood or brown material that looks like coffee grounds, red or dark brown urine, small red or purple spots on the skin, unusual bruising or bleeding ?Bleeding in the brain--severe headache, stiff neck, confusion, dizziness, change in vision, numbness or weakness of the face, arm, or leg, trouble speaking, trouble walking, vomiting ?Heavy periods ?This list may not describe  all possible side effects. Call your doctor for medical advice about side effects. You may report side effects to FDA at 1-800-FDA-1088. ?Where should I keep my medication? ?Keep out of the reach of children and pets. ?Store at room temperature between 20 and 25 degrees C (68 and 77 degrees F). Get rid of any unused medication after the expiration date. ?To get rid of medications that are no longer needed or expired: ?Take the medication to a medication take-back program. Check with your pharmacy or law enforcement to find a location. ?If you cannot return the medication, check the label or package insert to see if the medication should be thrown out in the garbage or flushed down the toilet. If you are not sure, ask your care team. If it is safe to put in the trash, empty the medication out of the container. Mix the medication with cat litter, dirt, coffee grounds, or other unwanted substance. Seal the mixture in a bag or container. Put it in the trash. ?NOTE: This sheet is a summary. It may not cover all possible information. If you have questions about this medicine, talk to your doctor, pharmacist, or health care provider. ?? 2023 Elsevier/Gold Standard (2020-02-28 00:00:00) ? ?

## 2021-06-17 NOTE — Progress Notes (Signed)
? ?Electrophysiology Office Note ? ? ?Date:  06/17/2021  ? ?ID:  [name], DOB Dec 10, 1940, MRN [id number] ? ?PCP:  [name], MD  ?Cardiologist:  [name] ?Primary Electrophysiologist:  Xavior Niazi [name], MD   ? ?No chief complaint on file. ? ?  ?History of Present Illness: ?[name] is a 81 y.o. female who is being seen today for the evaluation of PVCs at the request of [name]. Presenting today for electrophysiology evaluation.   ? ?She has a history significant for hypertension and right bundle branch block.  She was noted to have PVCs and wore a 24-hour monitor that showed a 15% burden.  She was started on flecainide with a reduction of her burden to less than 1%. ? ?Today, denies symptoms of palpitations, chest pain, shortness of breath, orthopnea, PND, lower extremity edema, claudication, dizziness, presyncope, syncope, bleeding, or neurologic sequela. The patient is tolerating medications without difficulties.  He feels weak and fatigued today.  She has mild shortness of breath.  She states that she has been taking more naps lately, though she cannot determine how long this has been occurring.  Despite that, she continues to do her daily activities without issue. ? ? ?Past Medical History:  ?Diagnosis Date  ? Allergy   ? Anemia   ? hx of years ago   ? Anxiety   ? Arthritis   ? Blood transfusion without reported diagnosis   ? after hip replacement 2013  ? Cataract   ? removed both eyes  ? Chronic kidney disease   ? hx of uti   ? Colon polyp   ? Depression   ? DJD (degenerative joint disease)   ? GERD (gastroesophageal reflux disease)   ? Hypertension   ? Lumbar radiculopathy   ? S/P ESI- Dr Maxie Better Dr. Nelva Bush  ? Neuromuscular disorder (HCC)   ? numbness in middle toes on left leg   ? Osteoporosis   ? Pneumonia   ? hx of   ? RBBB   ? Seasonal allergies   ? Syncope and collapse   ? in the setting of diarrheal illness-Dr turner  ? ?Past Surgical History:  ?Procedure Laterality Date  ? BACK  SURGERY  2006  ? CARDIAC CATHETERIZATION  2003  ? Normal coronary arteries  ? COLONOSCOPY    ? POLYPECTOMY    ? [place]  ? TOTAL HIP ARTHROPLASTY  2008  ? right  ? TOTAL HIP ARTHROPLASTY  12/16/2011  ? Procedure: TOTAL HIP ARTHROPLASTY;  Surgeon: [name], MD;  Location: [facility];  Service: Orthopedics;  Laterality: Left;  ? VAGINAL HYSTERECTOMY  1981  ? WISDOM TOOTH EXTRACTION    ? ? ? ?Current Outpatient Medications  ?Medication Sig Dispense Refill  ? apixaban (ELIQUIS) 5 MG TABS tablet Take 1 tablet (5 mg total) by mouth 2 (two) times daily. 60 tablet 6  ? b complex vitamins capsule Take 1 capsule by mouth daily.    ? Calcium Carbonate-Vitamin D (CALCIUM-D PO) Take 1,200 mg by mouth daily.     ? conjugated estrogens (PREMARIN) vaginal cream Place 1 Applicatorful vaginally once a week.     ? Desonide Crea-Moisturizing Lot 0.05 % KIT Apply 1 Dose topically as directed. Uses every other day as a preventive for fungus  Under breasts and abdominal area     ? fish oil-omega-3 fatty acids 1000 MG capsule Take 1 g by mouth daily.     ? flecainide (TAMBOCOR) 50 MG tablet  TAKE ONE TABLET BY MOUTH TWICE DAILY 180 tablet 1  ? Ginger, Zingiber officinalis, (GINGER ROOT PO) Take by mouth.    ? Glucosamine HCl (GLUCOSAMINE PO) Take 1 capsule by mouth 2 (two) times daily.    ? hydrochlorothiazide (HYDRODIURIL) 25 MG tablet TAKE 1 TABLET BY MOUTH DAILY. 90 tablet 1  ? HYDROcodone-acetaminophen (NORCO/VICODIN) 5-325 MG per tablet Take 0.5 tablets by mouth every 6 (six) hours as needed for moderate pain.    ? loratadine (CLARITIN) 10 MG tablet Take 10 mg by mouth daily.    ? LORazepam (ATIVAN) 1 MG tablet Take 0.5 mg by mouth at bedtime. scheduled    ? Multiple Vitamin (MULTIVITAMIN) tablet Take 1 tablet by mouth daily.    ? nitrofurantoin, macrocrystal-monohydrate, (MACROBID) 100 MG capsule Take 100 mg by mouth 2 (two) times daily.    ? olmesartan (BENICAR) 40 MG tablet Take 1 tablet (40 mg total) by mouth  daily. Please keep upcoming appt with Dr. Curt Bears in May 2023 before anymore refills. Thank you Final attempt 90 tablet 1  ? PARoxetine (PAXIL) 40 MG tablet Take 40 mg by mouth daily before breakfast.     ? potassium chloride SA (KLOR-CON M) 20 MEQ tablet TAKE 1 TABLET BY MOUTH TWICE DAILY. 180 tablet 1  ? TURMERIC PO Take 1 capsule by mouth daily.    ? vitamin C (ASCORBIC ACID) 500 MG tablet Take 1,000 mg by mouth daily.     ? metoprolol succinate (TOPROL-XL) 50 MG 24 hr tablet TAKE ONE TABLET BY MOUTH AT BEDTIME 90 tablet 3  ? ?Current Facility-Administered Medications  ?Medication Dose Route Frequency Provider Last Rate Last Admin  ? 0.9 %  sodium chloride infusion  500 mL Intravenous Once [name], [name], MD      ? ? ?Allergies:   Ciprocin-fluocin-procin [fluocinolone], Citric acid, Cleocin [clindamycin hcl], Cranberry, Iodine, and Septra [sulfamethoxazole-trimethoprim]  ? ?Social History:  The patient  reports that she has never smoked. She has never used smokeless tobacco. She reports that she does not drink alcohol and does not use drugs.  ? ?Family History:  The patient's family history includes Colon cancer (age of onset: 2) in her mother; Congestive Heart Failure in her father; Esophageal cancer in her paternal uncle; Lung cancer in her mother; Pancreatic cancer in her father; Skin cancer in her sister.  ? ?ROS:  Please see the history of present illness.   Otherwise, review of systems is positive for none.   All other systems are reviewed and negative.  ? ?PHYSICAL EXAM: ?VS:  BP 134/84   Pulse 82   Ht 5' 5.5" (1.664 m)   Wt 183 lb (83 kg)   SpO2 93%   BMI 29.99 kg/m?  , BMI Body mass index is 29.99 kg/m?. ?GEN: Well nourished, well developed, in no acute distress  ?HEENT: normal  ?Neck: no JVD, carotid bruits, or masses ?Cardiac: irregular; no murmurs, rubs, or gallops,no edema  ?Respiratory:  clear to auscultation bilaterally, normal work of breathing ?GI: soft, nontender, nondistended, +  BS ?MS: no deformity or atrophy  ?Skin: warm and dry ?Neuro:  Strength and sensation are intact ?Psych: euthymic mood, full affect ? ?EKG:  EKG is ordered today. ?Personal review of the ekg ordered shows atrial fibrillation, right bundle branch block ? ?Recent Labs: ?No results found for requested labs within last 8760 hours.  ? ? ?Lipid Panel  ?No results found for: CHOL, TRIG, HDL, CHOLHDL, VLDL, LDLCALC, LDLDIRECT ? ? ?Wt Readings from  Last 3 Encounters:  ?06/17/21 183 lb (83 kg)  ?12/31/19 190 lb 9.6 oz (86.5 kg)  ?05/30/19 195 lb (88.5 kg)  ?  ? ? ?Other studies Reviewed: ?Additional studies/ records that were reviewed today include: TTE 01/25/18  ?Review of the above records today demonstrates:  ?- Left ventricle: The cavity size was normal. Wall thickness was ?  normal. Systolic function was normal. The estimated ejection ?  fraction was in the range of 50% to 55%. Wall motion was normal; ?  there were no regional wall motion abnormalities. ?- Mitral valve: There was mild regurgitation. ? ?SPECT 01/25/18 ?Nuclear stress EF: 57%. ?The left ventricular ejection fraction is normal (55-65%). ?There was no ST segment deviation noted during stress. ?The study is normal. ?This is a low risk study. ?  ?Cardiac monitor 08/03/2018 personally reviewed ?Max 114 bpm 03:23pm, 06/05 ?Min 45 bpm 08:59am, 06/05 ?Avg 54 bpm ?<1% PVCs and PACs ?Sinus rhythm with episodes of sinus tachycardia and SVT ?23 supraventricular runs longest 8 beats at 114 bpm ? ?ASSESSMENT AND PLAN: ? ?1.  PVCs: 50% noted on cardiac monitor.  PVCs appear to be coming from the LV septum with a right bundle branch block morphology.  They are quite narrow and are potentially coming from the conduction system.  Currently on flecainide 50 mg twice daily.  Has had a reduced burden of less than 1% on most recent monitoring.  QRS remained stable.  High risk medication monitoring for flecainide.  The right bundle branch block, which is remained somewhat stable  on her flecainide.  Despite that, she is in atrial fibrillation. ? ?2.  Hypertension: Currently well controlled ? ?3.  Atrial fibrillation: Unclear as to whether or not she is paroxysmal or persistent.  She feels weak a

## 2021-06-20 ENCOUNTER — Encounter: Payer: Self-pay | Admitting: Cardiology

## 2021-06-22 ENCOUNTER — Telehealth: Payer: Self-pay

## 2021-06-22 NOTE — Telephone Encounter (Signed)
Called pt to let her know that Sherri (Dr. Curt Bears RN)  has reviewed her labs and that it's ok for her to go ahead and start Eliquis '5mg'$  twice daily. She stated that she had spoke to someone at the Pharmacy and they told her it was ok to go ahead and start. She started the medication on 06/19/2021. ?

## 2021-07-08 ENCOUNTER — Encounter (HOSPITAL_COMMUNITY): Payer: Self-pay | Admitting: Physician Assistant

## 2021-07-08 ENCOUNTER — Ambulatory Visit (HOSPITAL_COMMUNITY)
Admission: RE | Admit: 2021-07-08 | Discharge: 2021-07-08 | Disposition: A | Discharge status: Home / Self Care | Payer: Medicare PPO | Source: Ambulatory Visit | Attending: Physician Assistant | Admitting: Physician Assistant

## 2021-07-08 VITALS — BP 138/86 | HR 85 | Ht 65.5 in | Wt 175.0 lb

## 2021-07-08 DIAGNOSIS — D6869 Other thrombophilia: Secondary | ICD-10-CM | POA: Insufficient documentation

## 2021-07-08 DIAGNOSIS — Z79899 Other long term (current) drug therapy: Secondary | ICD-10-CM | POA: Diagnosis not present

## 2021-07-08 DIAGNOSIS — I493 Ventricular premature depolarization: Secondary | ICD-10-CM | POA: Insufficient documentation

## 2021-07-08 DIAGNOSIS — Z7901 Long term (current) use of anticoagulants: Secondary | ICD-10-CM | POA: Diagnosis not present

## 2021-07-08 DIAGNOSIS — I4819 Other persistent atrial fibrillation: Secondary | ICD-10-CM | POA: Diagnosis not present

## 2021-07-08 DIAGNOSIS — I10 Essential (primary) hypertension: Secondary | ICD-10-CM | POA: Insufficient documentation

## 2021-07-08 LAB — COMPREHENSIVE METABOLIC PANEL
ALT: 19 U/L (ref 0–44)
AST: 19 U/L (ref 15–41)
Albumin: 3.7 g/dL (ref 3.5–5.0)
Alkaline Phosphatase: 51 U/L (ref 38–126)
Anion gap: 8 (ref 5–15)
BUN: 27 mg/dL — ABNORMAL HIGH (ref 8–23)
CO2: 22 mmol/L (ref 22–32)
Calcium: 9.3 mg/dL (ref 8.9–10.3)
Chloride: 108 mmol/L (ref 98–111)
Creatinine, Ser: 0.98 mg/dL (ref 0.44–1.00)
GFR, Estimated: 58 mL/min — ABNORMAL LOW (ref >60)
Glucose, Bld: 101 mg/dL — ABNORMAL HIGH (ref 70–99)
Potassium: 4.1 mmol/L (ref 3.5–5.1)
Sodium: 138 mmol/L (ref 135–145)
Total Bilirubin: 0.6 mg/dL (ref 0.3–1.2)
Total Protein: 6.1 g/dL — ABNORMAL LOW (ref 6.5–8.1)

## 2021-07-08 LAB — TSH: TSH: 3.772 u[IU]/mL (ref 0.350–4.500)

## 2021-07-08 MED ORDER — AMIODARONE HCL 200 MG PO TABS: ORAL_TABLET | ORAL | 0 refills | Status: DC

## 2021-07-08 NOTE — H&P (View-Only) (Signed)
Primary Care Physician: [name], MD Primary Cardiologist: Dr [name] Primary Electrophysiologist: Dr Curt Bears  Referring Physician: Dr Beola Cord [name] is a 81 y.o. female with a history of PVCs, HTN, atrial fibrillation who presents for follow up in the [facility].  The patient was initially diagnosed with atrial fibrillation 06/17/21 after presenting to Dr Curt Bears office with symptoms of weakness, fatigue, and SOB. ECG showed rate controlled afib. Patient was started on Eliquis for a CHADS2VASC score of 4.   On follow up today, patient reports that she continues to have symptoms of fatigue and SOB. She remains in rate controlled afib. She is tolerating anticoagulation without issue.   Today, she denies symptoms of palpitations, chest pain, orthopnea, PND, lower extremity edema, dizziness, presyncope, syncope, snoring, daytime somnolence, bleeding, or neurologic sequela. The patient is tolerating medications without difficulties and is otherwise without complaint today.    Atrial Fibrillation Risk Factors:  she does not have symptoms or diagnosis of sleep apnea. she does not have a history of rheumatic fever.   she has a BMI of Body mass index is 28.68 kg/m.[name] Filed Weights   07/08/21 1318  Weight: 79.4 kg    Family History  Problem Relation Age of Onset   Colon cancer Mother 31   Lung cancer Mother    Pancreatic cancer Father    Congestive Heart Failure Father    Skin cancer Sister    Esophageal cancer Paternal Uncle    Stomach cancer Neg Hx    Colon polyps Neg Hx    Rectal cancer Neg Hx      Atrial Fibrillation Management history:  Previous antiarrhythmic drugs: flecainide Previous cardioversions: none Previous ablations: none CHADS2VASC score: 4 Anticoagulation history: Eliquis   Past Medical History:  Diagnosis Date   Allergy    Anemia    hx of years ago    Anxiety    Arthritis    Blood transfusion without  reported diagnosis    after hip replacement 2013   Cataract    removed both eyes   Chronic kidney disease    hx of uti    Colon polyp    Depression    DJD (degenerative joint disease)    GERD (gastroesophageal reflux disease)    Hypertension    Lumbar radiculopathy    S/P ESI- Dr Maxie Better Dr. Nelva Bush   Neuromuscular disorder ([place])    numbness in middle toes on left leg    Osteoporosis    Pneumonia    hx of    RBBB    Seasonal allergies    Syncope and collapse    in the setting of diarrheal illness-Dr turner   Past Surgical History:  Procedure Laterality Date   BACK SURGERY  2006   CARDIAC CATHETERIZATION  2003   Normal coronary arteries   COLONOSCOPY     POLYPECTOMY     [place]   TOTAL HIP ARTHROPLASTY  2008   right   TOTAL HIP ARTHROPLASTY  12/16/2011   Procedure: TOTAL HIP ARTHROPLASTY;  Surgeon: [name], MD;  Location: [facility];  Service: Orthopedics;  Laterality: Left;   VAGINAL HYSTERECTOMY  1981   WISDOM TOOTH EXTRACTION      Current Outpatient Medications  Medication Sig Dispense Refill   [START ON 07/12/2021] amiodarone (PACERONE) 200 MG tablet Take 1 tablet by mouth twice a day for 1 month then reduce to 1 tablet daily 60 tablet 0  apixaban (ELIQUIS) 5 MG TABS tablet Take 1 tablet (5 mg total) by mouth 2 (two) times daily. 60 tablet 6   b complex vitamins capsule Take 1 capsule by mouth daily.     buPROPion (WELLBUTRIN XL) 150 MG 24 hr tablet Take 1 tablet by mouth every morning.     Calcium Carbonate-Vitamin D (CALCIUM-D PO) Take 1,200 mg by mouth daily.      conjugated estrogens (PREMARIN) vaginal cream Place 1 Applicatorful vaginally once a week.      Desonide Crea-Moisturizing Lot 0.05 % KIT Apply 1 Dose topically as directed. Uses every other day as a preventive for fungus  Under breasts and abdominal area      fish oil-omega-3 fatty acids 1000 MG capsule Take 1 g by mouth daily.      Ginger, Zingiber officinalis, (GINGER ROOT PO) Take by  mouth.     Glucosamine HCl (GLUCOSAMINE PO) Take 1 capsule by mouth 2 (two) times daily.     hydrochlorothiazide (HYDRODIURIL) 25 MG tablet TAKE 1 TABLET BY MOUTH DAILY. 90 tablet 1   HYDROcodone-acetaminophen (NORCO/VICODIN) 5-325 MG per tablet Take 0.5 tablets by mouth every 6 (six) hours as needed for moderate pain.     loratadine (CLARITIN) 10 MG tablet Take 10 mg by mouth daily.     LORazepam (ATIVAN) 1 MG tablet Take 0.5 mg by mouth at bedtime. scheduled     metoprolol succinate (TOPROL-XL) 50 MG 24 hr tablet TAKE ONE TABLET BY MOUTH AT BEDTIME 90 tablet 3   Multiple Vitamin (MULTIVITAMIN) tablet Take 1 tablet by mouth daily.     nitrofurantoin, macrocrystal-monohydrate, (MACROBID) 100 MG capsule Take 100 mg by mouth 2 (two) times daily.     olmesartan (BENICAR) 40 MG tablet Take 1 tablet (40 mg total) by mouth daily. Please keep upcoming appt with Dr. Curt Bears in May 2023 before anymore refills. Thank you Final attempt 90 tablet 1   PARoxetine (PAXIL) 40 MG tablet Take 40 mg by mouth daily before breakfast.      potassium chloride SA (KLOR-CON M) 20 MEQ tablet TAKE 1 TABLET BY MOUTH TWICE DAILY. 180 tablet 1   TURMERIC PO Take 1 capsule by mouth daily.     vitamin C (ASCORBIC ACID) 500 MG tablet Take 1,000 mg by mouth daily.      Current Facility-Administered Medications  Medication Dose Route Frequency Provider Last Rate Last Admin   0.9 %  sodium chloride infusion  500 mL Intravenous Once [name], Kavitha V, MD        Allergies  Allergen Reactions   Ciprocin-Fluocin-Procin [Fluocinolone] Diarrhea   Citric Acid     Headache, swelling around eyes and mouth   Cleocin [Clindamycin Hcl] Diarrhea   Cranberry Hives   Iodine    Septra [Sulfamethoxazole-Trimethoprim]     Social History   Socioeconomic History   Marital status: Widowed    Spouse name: Not on file   Number of children: Not on file   Years of education: Not on file   Highest education level: Not on file   Occupational History   Occupation: Retired Education officer, museum  Tobacco Use   Smoking status: Never   Smokeless tobacco: Never   Tobacco comments:    Never smoke 07/08/21  Vaping Use   Vaping Use: Never used  Substance and Sexual Activity   Alcohol use: No   Drug use: No   Sexual activity: Not on file  Other Topics Concern   Not on file  Social History  Narrative   Widowed.  Lived with a companion prior to going to [facility] for rehab.   Social Determinants of Health   Financial Resource Strain: Not on file  Food Insecurity: Not on file  Transportation Needs: Not on file  Physical Activity: Not on file  Stress: Not on file  Social Connections: Not on file  Intimate Partner Violence: Not on file     ROS- All systems are reviewed and negative except as per the HPI above.  Physical Exam: Vitals:   07/08/21 1318  BP: 138/86  Pulse: 85  Weight: 79.4 kg  Height: 5' 5.5" (1.664 m)    GEN- The patient is a well appearing elderly female, alert and oriented x 3 today.   Head- normocephalic, atraumatic Eyes-  Sclera clear, conjunctiva pink Ears- hearing intact Oropharynx- clear Neck- supple  Lungs- Clear to ausculation bilaterally, normal work of breathing Heart- irregular rate and rhythm, no murmurs, rubs or gallops  GI- soft, NT, ND, + BS Extremities- no clubbing, cyanosis, or edema MS- no significant deformity or atrophy Skin- no rash or lesion Psych- euthymic mood, full affect Neuro- strength and sensation are intact  Wt Readings from Last 3 Encounters:  07/08/21 79.4 kg  06/17/21 83 kg  12/31/19 86.5 kg    EKG today demonstrates  Afib, RBBB Vent. rate 85 BPM PR interval * ms QRS duration 186 ms QT/QTcB 452/537 ms  Echo 01/25/18 demonstrated  Left ventricle: The cavity size was normal. Wall thickness was    normal. Systolic function was normal. The estimated ejection    fraction was in the range of 50% to 55%. Wall motion was normal;    there were no  regional wall motion abnormalities.  - Mitral valve: There was mild regurgitation.  Epic records are reviewed at length today  CHA2DS2-VASc Score = 4  The patient's score is based upon: CHF History: 0 HTN History: 1 Diabetes History: 0 Stroke History: 0 Vascular Disease History: 0 Age Score: 2 Gender Score: 1       ASSESSMENT AND PLAN: 1. Persistent Atrial Fibrillation (ICD10:  I48.19) The patient's CHA2DS2-VASc score is 4, indicating a 4.8% annual risk of stroke.   Patient appears persistently in afib. Will stop flecainide and start amiodarone 200 mg BID after 3 day washout of flecainide. If she does not chemically convert with amiodarone, will proceed with DCCV.  Check bmet/TSH today.  Continue Eliquis 5 mg BID Continue Toprol 50 mg daily  2. Secondary Hypercoagulable State (ICD10:  D68.69) The patient is at significant risk for stroke/thromboembolism based upon her CHA2DS2-VASc Score of 4.  Continue Apixaban (Eliquis).   3. HTN Stable, no changes today.  4. PVCs Continue BB, amiodarone as above.   Follow up in the AF clinic post DCCV.    [facility] [address] [place], [place] [address] [phone] 07/08/2021 3:15 PM

## 2021-07-08 NOTE — Progress Notes (Signed)
Primary Care Physician: [name], MD Primary Cardiologist: Dr [name] Primary Electrophysiologist: Dr Curt Bears  Referring Physician: Dr Beola Cord [name] is a 81 y.o. female with a history of PVCs, HTN, atrial fibrillation who presents for follow up in the [facility].  The patient was initially diagnosed with atrial fibrillation 06/17/21 after presenting to Dr Curt Bears office with symptoms of weakness, fatigue, and SOB. ECG showed rate controlled afib. Patient was started on Eliquis for a CHADS2VASC score of 4.   On follow up today, patient reports that she continues to have symptoms of fatigue and SOB. She remains in rate controlled afib. She is tolerating anticoagulation without issue.   Today, she denies symptoms of palpitations, chest pain, orthopnea, PND, lower extremity edema, dizziness, presyncope, syncope, snoring, daytime somnolence, bleeding, or neurologic sequela. The patient is tolerating medications without difficulties and is otherwise without complaint today.    Atrial Fibrillation Risk Factors:  she does not have symptoms or diagnosis of sleep apnea. she does not have a history of rheumatic fever.   she has a BMI of Body mass index is 28.68 kg/m.[name] Filed Weights   07/08/21 1318  Weight: 79.4 kg    Family History  Problem Relation Age of Onset   Colon cancer Mother 31   Lung cancer Mother    Pancreatic cancer Father    Congestive Heart Failure Father    Skin cancer Sister    Esophageal cancer Paternal Uncle    Stomach cancer Neg Hx    Colon polyps Neg Hx    Rectal cancer Neg Hx      Atrial Fibrillation Management history:  Previous antiarrhythmic drugs: flecainide Previous cardioversions: none Previous ablations: none CHADS2VASC score: 4 Anticoagulation history: Eliquis   Past Medical History:  Diagnosis Date   Allergy    Anemia    hx of years ago    Anxiety    Arthritis    Blood transfusion without  reported diagnosis    after hip replacement 2013   Cataract    removed both eyes   Chronic kidney disease    hx of uti    Colon polyp    Depression    DJD (degenerative joint disease)    GERD (gastroesophageal reflux disease)    Hypertension    Lumbar radiculopathy    S/P ESI- Dr Maxie Better Dr. Nelva Bush   Neuromuscular disorder ([place])    numbness in middle toes on left leg    Osteoporosis    Pneumonia    hx of    RBBB    Seasonal allergies    Syncope and collapse    in the setting of diarrheal illness-Dr turner   Past Surgical History:  Procedure Laterality Date   BACK SURGERY  2006   CARDIAC CATHETERIZATION  2003   Normal coronary arteries   COLONOSCOPY     POLYPECTOMY     [place]   TOTAL HIP ARTHROPLASTY  2008   right   TOTAL HIP ARTHROPLASTY  12/16/2011   Procedure: TOTAL HIP ARTHROPLASTY;  Surgeon: [name], MD;  Location: [facility];  Service: Orthopedics;  Laterality: Left;   VAGINAL HYSTERECTOMY  1981   WISDOM TOOTH EXTRACTION      Current Outpatient Medications  Medication Sig Dispense Refill   [START ON 07/12/2021] amiodarone (PACERONE) 200 MG tablet Take 1 tablet by mouth twice a day for 1 month then reduce to 1 tablet daily 60 tablet 0  apixaban (ELIQUIS) 5 MG TABS tablet Take 1 tablet (5 mg total) by mouth 2 (two) times daily. 60 tablet 6   b complex vitamins capsule Take 1 capsule by mouth daily.     buPROPion (WELLBUTRIN XL) 150 MG 24 hr tablet Take 1 tablet by mouth every morning.     Calcium Carbonate-Vitamin D (CALCIUM-D PO) Take 1,200 mg by mouth daily.      conjugated estrogens (PREMARIN) vaginal cream Place 1 Applicatorful vaginally once a week.      Desonide Crea-Moisturizing Lot 0.05 % KIT Apply 1 Dose topically as directed. Uses every other day as a preventive for fungus  Under breasts and abdominal area      fish oil-omega-3 fatty acids 1000 MG capsule Take 1 g by mouth daily.      Ginger, Zingiber officinalis, (GINGER ROOT PO) Take by  mouth.     Glucosamine HCl (GLUCOSAMINE PO) Take 1 capsule by mouth 2 (two) times daily.     hydrochlorothiazide (HYDRODIURIL) 25 MG tablet TAKE 1 TABLET BY MOUTH DAILY. 90 tablet 1   HYDROcodone-acetaminophen (NORCO/VICODIN) 5-325 MG per tablet Take 0.5 tablets by mouth every 6 (six) hours as needed for moderate pain.     loratadine (CLARITIN) 10 MG tablet Take 10 mg by mouth daily.     LORazepam (ATIVAN) 1 MG tablet Take 0.5 mg by mouth at bedtime. scheduled     metoprolol succinate (TOPROL-XL) 50 MG 24 hr tablet TAKE ONE TABLET BY MOUTH AT BEDTIME 90 tablet 3   Multiple Vitamin (MULTIVITAMIN) tablet Take 1 tablet by mouth daily.     nitrofurantoin, macrocrystal-monohydrate, (MACROBID) 100 MG capsule Take 100 mg by mouth 2 (two) times daily.     olmesartan (BENICAR) 40 MG tablet Take 1 tablet (40 mg total) by mouth daily. Please keep upcoming appt with Dr. Curt Bears in May 2023 before anymore refills. Thank you Final attempt 90 tablet 1   PARoxetine (PAXIL) 40 MG tablet Take 40 mg by mouth daily before breakfast.      potassium chloride SA (KLOR-CON M) 20 MEQ tablet TAKE 1 TABLET BY MOUTH TWICE DAILY. 180 tablet 1   TURMERIC PO Take 1 capsule by mouth daily.     vitamin C (ASCORBIC ACID) 500 MG tablet Take 1,000 mg by mouth daily.      Current Facility-Administered Medications  Medication Dose Route Frequency Provider Last Rate Last Admin   0.9 %  sodium chloride infusion  500 mL Intravenous Once [name], Kavitha V, MD        Allergies  Allergen Reactions   Ciprocin-Fluocin-Procin [Fluocinolone] Diarrhea   Citric Acid     Headache, swelling around eyes and mouth   Cleocin [Clindamycin Hcl] Diarrhea   Cranberry Hives   Iodine    Septra [Sulfamethoxazole-Trimethoprim]     Social History   Socioeconomic History   Marital status: Widowed    Spouse name: Not on file   Number of children: Not on file   Years of education: Not on file   Highest education level: Not on file   Occupational History   Occupation: Retired Education officer, museum  Tobacco Use   Smoking status: Never   Smokeless tobacco: Never   Tobacco comments:    Never smoke 07/08/21  Vaping Use   Vaping Use: Never used  Substance and Sexual Activity   Alcohol use: No   Drug use: No   Sexual activity: Not on file  Other Topics Concern   Not on file  Social History  Narrative   Widowed.  Lived with a companion prior to going to [facility] for rehab.   Social Determinants of Health   Financial Resource Strain: Not on file  Food Insecurity: Not on file  Transportation Needs: Not on file  Physical Activity: Not on file  Stress: Not on file  Social Connections: Not on file  Intimate Partner Violence: Not on file     ROS- All systems are reviewed and negative except as per the HPI above.  Physical Exam: Vitals:   07/08/21 1318  BP: 138/86  Pulse: 85  Weight: 79.4 kg  Height: 5' 5.5" (1.664 m)    GEN- The patient is a well appearing elderly female, alert and oriented x 3 today.   Head- normocephalic, atraumatic Eyes-  Sclera clear, conjunctiva pink Ears- hearing intact Oropharynx- clear Neck- supple  Lungs- Clear to ausculation bilaterally, normal work of breathing Heart- irregular rate and rhythm, no murmurs, rubs or gallops  GI- soft, NT, ND, + BS Extremities- no clubbing, cyanosis, or edema MS- no significant deformity or atrophy Skin- no rash or lesion Psych- euthymic mood, full affect Neuro- strength and sensation are intact  Wt Readings from Last 3 Encounters:  07/08/21 79.4 kg  06/17/21 83 kg  12/31/19 86.5 kg    EKG today demonstrates  Afib, RBBB Vent. rate 85 BPM PR interval * ms QRS duration 186 ms QT/QTcB 452/537 ms  Echo 01/25/18 demonstrated  Left ventricle: The cavity size was normal. Wall thickness was    normal. Systolic function was normal. The estimated ejection    fraction was in the range of 50% to 55%. Wall motion was normal;    there were no  regional wall motion abnormalities.  - Mitral valve: There was mild regurgitation.  Epic records are reviewed at length today  CHA2DS2-VASc Score = 4  The patient's score is based upon: CHF History: 0 HTN History: 1 Diabetes History: 0 Stroke History: 0 Vascular Disease History: 0 Age Score: 2 Gender Score: 1       ASSESSMENT AND PLAN: 1. Persistent Atrial Fibrillation (ICD10:  I48.19) The patient's CHA2DS2-VASc score is 4, indicating a 4.8% annual risk of stroke.   Patient appears persistently in afib. Will stop flecainide and start amiodarone 200 mg BID after 3 day washout of flecainide. If she does not chemically convert with amiodarone, will proceed with DCCV.  Check bmet/TSH today.  Continue Eliquis 5 mg BID Continue Toprol 50 mg daily  2. Secondary Hypercoagulable State (ICD10:  D68.69) The patient is at significant risk for stroke/thromboembolism based upon her CHA2DS2-VASc Score of 4.  Continue Apixaban (Eliquis).   3. HTN Stable, no changes today.  4. PVCs Continue BB, amiodarone as above.   Follow up in the AF clinic post DCCV.    [facility] [address] [place], [place] [address] [phone] 07/08/2021 3:15 PM

## 2021-07-08 NOTE — Patient Instructions (Addendum)
STOP flecainide  On Monday, May 29th - Start Amiodarone '200mg'$  twice a day for one month then reduce to once daily - take with food  Cardioversion scheduled for Thursday, June 15th  -come to afib clinic for labs at [place] at the [facility] and go to admitting at 10  - Do not eat or drink anything after midnight the night prior to your procedure.  - Take all your morning medication (except diabetic medications) with a sip of water prior to arrival.  - You will not be able to drive home after your procedure.  - Do NOT miss any doses of your blood thinner - if you should miss a dose please notify our office immediately.  - If you feel as if you go back into normal rhythm prior to scheduled cardioversion, please notify our office immediately. If your procedure is canceled in the cardioversion suite you will be charged a cancellation fee.

## 2021-07-19 ENCOUNTER — Encounter (HOSPITAL_COMMUNITY): Payer: Self-pay | Admitting: Cardiovascular Disease

## 2021-07-20 NOTE — Progress Notes (Signed)
Attempted to obtain medical history via telephone, unable to reach at this time. HIPAA compliant voicemail message left requesting return call to pre surgical testing department. 

## 2021-07-23 ENCOUNTER — Telehealth: Payer: Self-pay | Admitting: Cardiology

## 2021-07-23 NOTE — Telephone Encounter (Signed)
Left detailed message informing pt to restart medication if not already done so.

## 2021-07-23 NOTE — Telephone Encounter (Signed)
Pt c/o medication issue:  1. Name of Medication:   amiodarone (PACERONE) 200 MG tablet    apixaban (ELIQUIS) 5 MG TABS tablet    2. How are you currently taking this medication (dosage and times per day)?   3. Are you having a reaction (difficulty breathing--STAT)? No  4. What is your medication issue?  Pt states that she was told to make nurse aware when she missed a dose of medications. Pt said that she forgot to take both last night. Please advise

## 2021-07-29 ENCOUNTER — Encounter (HOSPITAL_COMMUNITY): Payer: Self-pay | Admitting: Certified Registered"

## 2021-07-29 ENCOUNTER — Other Ambulatory Visit: Payer: Self-pay

## 2021-07-29 ENCOUNTER — Encounter (HOSPITAL_COMMUNITY)
Admission: RE | Disposition: A | Discharge status: Home / Self Care | Payer: Self-pay | Source: Home / Self Care | Attending: Cardiovascular Disease

## 2021-07-29 ENCOUNTER — Ambulatory Visit (HOSPITAL_COMMUNITY)
Admission: RE | Admit: 2021-07-29 | Discharge: 2021-07-29 | Disposition: A | Discharge status: Home / Self Care | Payer: Medicare PPO | Attending: Cardiovascular Disease | Admitting: Cardiovascular Disease

## 2021-07-29 ENCOUNTER — Ambulatory Visit (HOSPITAL_COMMUNITY)
Admission: RE | Admit: 2021-07-29 | Discharge: 2021-07-29 | Disposition: A | Discharge status: Home / Self Care | Payer: Medicare PPO | Source: Ambulatory Visit | Attending: Physician Assistant | Admitting: Physician Assistant

## 2021-07-29 ENCOUNTER — Encounter (HOSPITAL_COMMUNITY): Payer: Self-pay | Admitting: Cardiovascular Disease

## 2021-07-29 ENCOUNTER — Encounter: Payer: Self-pay | Admitting: Cardiology

## 2021-07-29 DIAGNOSIS — D6869 Other thrombophilia: Secondary | ICD-10-CM | POA: Insufficient documentation

## 2021-07-29 DIAGNOSIS — I493 Ventricular premature depolarization: Secondary | ICD-10-CM | POA: Insufficient documentation

## 2021-07-29 DIAGNOSIS — I4819 Other persistent atrial fibrillation: Secondary | ICD-10-CM | POA: Insufficient documentation

## 2021-07-29 DIAGNOSIS — I10 Essential (primary) hypertension: Secondary | ICD-10-CM | POA: Diagnosis not present

## 2021-07-29 DIAGNOSIS — Z538 Procedure and treatment not carried out for other reasons: Secondary | ICD-10-CM | POA: Diagnosis not present

## 2021-07-29 LAB — CBC
HCT: 36.5 % (ref 36.0–46.0)
Hemoglobin: 12.1 g/dL (ref 12.0–15.0)
MCH: 30.9 pg (ref 26.0–34.0)
MCHC: 33.2 g/dL (ref 30.0–36.0)
MCV: 93.1 fL (ref 80.0–100.0)
Platelets: 231 10*3/uL (ref 150–400)
RBC: 3.92 MIL/uL (ref 3.87–5.11)
RDW: 12.2 % (ref 11.5–15.5)
WBC: 5.8 10*3/uL (ref 4.0–10.5)
nRBC: 0 % (ref 0.0–0.2)

## 2021-07-29 LAB — BASIC METABOLIC PANEL
Anion gap: 11 (ref 5–15)
BUN: 19 mg/dL (ref 8–23)
CO2: 24 mmol/L (ref 22–32)
Calcium: 8.9 mg/dL (ref 8.9–10.3)
Chloride: 104 mmol/L (ref 98–111)
Creatinine, Ser: 0.97 mg/dL (ref 0.44–1.00)
GFR, Estimated: 59 mL/min — ABNORMAL LOW (ref >60)
Glucose, Bld: 87 mg/dL (ref 70–99)
Potassium: 4.2 mmol/L (ref 3.5–5.1)
Sodium: 139 mmol/L (ref 135–145)

## 2021-07-29 SURGERY — CANCELLED PROCEDURE

## 2021-07-29 MED ORDER — APIXABAN 5 MG PO TABS: 5.0000 mg | ORAL_TABLET | Freq: Two times a day (BID) | ORAL | 1 refills | Status: DC

## 2021-07-29 NOTE — Telephone Encounter (Signed)
Received faxed refill request from [place].  Pt last saw Clint Fenton, PA on 07/08/21, last labs 07/08/21 Creat 0.98, age 81, weight 79.4kg, based on specified criteria pt is on appropriate dosage of Eliquis '5mg'$  BID for afib.  Will refill rx.

## 2021-07-29 NOTE — Interval H&P Note (Signed)
History and Physical Interval Note:  07/29/2021 10:13 AM  [name]  has presented today for surgery, with the diagnosis of AFIB.  The various methods of treatment have been discussed with the patient and family.   She missed a dose of her Eliquis last week.  Will do a TEE prior to her cardioversion . After consideration of risks, benefits and other options for treatment, the patient has consented to  Procedure(s): CARDIOVERSION (N/A) TRANSESOPHAGEAL ECHOCARDIOGRAM (TEE) (N/A) as a surgical intervention.  The patient's history has been reviewed, patient examined, no change in status, stable for surgery.  I have reviewed the patient's chart and labs.  Questions were answered to the patient's satisfaction.     Mertie Moores

## 2021-07-29 NOTE — Anesthesia Preprocedure Evaluation (Deleted)
Anesthesia Evaluation  Patient identified by MRN, date of birth, ID band Patient awake    Reviewed: Allergy & Precautions, H&P , NPO status , Patient's Chart, lab work & pertinent test results  Airway Mallampati: II   Neck ROM: full    Dental   Pulmonary neg pulmonary ROS,    breath sounds clear to auscultation       Cardiovascular hypertension, + dysrhythmias Atrial Fibrillation  Rhythm:irregular Rate:Normal     Neuro/Psych PSYCHIATRIC DISORDERS Anxiety Depression  Neuromuscular disease    GI/Hepatic GERD  ,  Endo/Other    Renal/GU      Musculoskeletal  (+) Arthritis ,   Abdominal   Peds  Hematology   Anesthesia Other Findings   Reproductive/Obstetrics                             Anesthesia Physical Anesthesia Plan  ASA: 3  Anesthesia Plan: MAC   Post-op Pain Management:    Induction: Intravenous  PONV Risk Score and Plan: 2 and Propofol infusion and Treatment may vary due to age or medical condition  Airway Management Planned: Nasal Cannula  Additional Equipment:   Intra-op Plan:   Post-operative Plan:   Informed Consent: I have reviewed the patients History and Physical, chart, labs and discussed the procedure including the risks, benefits and alternatives for the proposed anesthesia with the patient or authorized representative who has indicated his/her understanding and acceptance.     Dental advisory given  Plan Discussed with: CRNA, Anesthesiologist and Surgeon  Anesthesia Plan Comments:         Anesthesia Quick Evaluation

## 2021-07-29 NOTE — Progress Notes (Signed)
Pt presented to endoscopy today for cardioversion. Per pt, she missed a dose of eliquis on 6/8. MD Nahser was notified and ordered a change to a TEE/Cardioversion. Upon initial assessment, pt was in sinus bradycardia. 12 lead EKG was obtained which confirmed SB. MD Nahser again notified and procedure cancelled.  Debarah Crape, RN 07/29/21 10:31 AM

## 2021-08-02 ENCOUNTER — Other Ambulatory Visit: Payer: Self-pay

## 2021-08-09 ENCOUNTER — Ambulatory Visit (HOSPITAL_COMMUNITY): Payer: Medicare PPO | Admitting: Physician Assistant

## 2021-08-09 ENCOUNTER — Encounter (HOSPITAL_COMMUNITY): Payer: Self-pay

## 2021-08-10 ENCOUNTER — Other Ambulatory Visit (HOSPITAL_COMMUNITY): Payer: Self-pay | Admitting: Physician Assistant

## 2021-08-12 ENCOUNTER — Other Ambulatory Visit: Payer: Self-pay | Admitting: Cardiology

## 2021-08-21 DIAGNOSIS — E871 Hypo-osmolality and hyponatremia: Secondary | ICD-10-CM | POA: Diagnosis not present

## 2021-08-21 DIAGNOSIS — K5900 Constipation, unspecified: Secondary | ICD-10-CM | POA: Diagnosis not present

## 2021-08-21 DIAGNOSIS — Z7409 Other reduced mobility: Secondary | ICD-10-CM | POA: Diagnosis not present

## 2021-08-21 DIAGNOSIS — S82852A Displaced trimalleolar fracture of left lower leg, initial encounter for closed fracture: Secondary | ICD-10-CM | POA: Diagnosis not present

## 2021-08-21 DIAGNOSIS — S9305XA Dislocation of left ankle joint, initial encounter: Secondary | ICD-10-CM | POA: Diagnosis not present

## 2021-08-21 DIAGNOSIS — R262 Difficulty in walking, not elsewhere classified: Secondary | ICD-10-CM | POA: Diagnosis not present

## 2021-08-21 DIAGNOSIS — I10 Essential (primary) hypertension: Secondary | ICD-10-CM | POA: Diagnosis not present

## 2021-08-21 DIAGNOSIS — Z9181 History of falling: Secondary | ICD-10-CM | POA: Diagnosis not present

## 2021-08-21 DIAGNOSIS — S92332A Displaced fracture of third metatarsal bone, left foot, initial encounter for closed fracture: Secondary | ICD-10-CM | POA: Diagnosis not present

## 2021-08-21 DIAGNOSIS — I482 Chronic atrial fibrillation, unspecified: Secondary | ICD-10-CM | POA: Diagnosis not present

## 2021-08-21 DIAGNOSIS — Z96659 Presence of unspecified artificial knee joint: Secondary | ICD-10-CM | POA: Diagnosis not present

## 2021-08-21 DIAGNOSIS — F32A Depression, unspecified: Secondary | ICD-10-CM | POA: Diagnosis not present

## 2021-08-21 DIAGNOSIS — S82872A Displaced pilon fracture of left tibia, initial encounter for closed fracture: Secondary | ICD-10-CM | POA: Diagnosis not present

## 2021-08-21 DIAGNOSIS — W1830XA Fall on same level, unspecified, initial encounter: Secondary | ICD-10-CM | POA: Diagnosis not present

## 2021-08-21 DIAGNOSIS — S82852D Displaced trimalleolar fracture of left lower leg, subsequent encounter for closed fracture with routine healing: Secondary | ICD-10-CM | POA: Diagnosis not present

## 2021-08-21 DIAGNOSIS — I4510 Unspecified right bundle-branch block: Secondary | ICD-10-CM | POA: Diagnosis not present

## 2021-08-21 DIAGNOSIS — W182XXA Fall in (into) shower or empty bathtub, initial encounter: Secondary | ICD-10-CM | POA: Diagnosis not present

## 2021-08-21 DIAGNOSIS — R531 Weakness: Secondary | ICD-10-CM | POA: Diagnosis not present

## 2021-08-21 DIAGNOSIS — M79672 Pain in left foot: Secondary | ICD-10-CM | POA: Diagnosis not present

## 2021-08-21 DIAGNOSIS — S8252XD Displaced fracture of medial malleolus of left tibia, subsequent encounter for closed fracture with routine healing: Secondary | ICD-10-CM | POA: Diagnosis not present

## 2021-08-21 DIAGNOSIS — Z01818 Encounter for other preprocedural examination: Secondary | ICD-10-CM | POA: Diagnosis not present

## 2021-08-21 DIAGNOSIS — I4891 Unspecified atrial fibrillation: Secondary | ICD-10-CM | POA: Diagnosis not present

## 2021-08-21 DIAGNOSIS — R5381 Other malaise: Secondary | ICD-10-CM | POA: Diagnosis not present

## 2021-08-21 DIAGNOSIS — I480 Paroxysmal atrial fibrillation: Secondary | ICD-10-CM | POA: Diagnosis not present

## 2021-08-21 DIAGNOSIS — S92342A Displaced fracture of fourth metatarsal bone, left foot, initial encounter for closed fracture: Secondary | ICD-10-CM | POA: Diagnosis not present

## 2021-08-21 DIAGNOSIS — Z9889 Other specified postprocedural states: Secondary | ICD-10-CM | POA: Diagnosis not present

## 2021-08-21 DIAGNOSIS — S99912A Unspecified injury of left ankle, initial encounter: Secondary | ICD-10-CM | POA: Diagnosis not present

## 2021-08-21 DIAGNOSIS — S82892A Other fracture of left lower leg, initial encounter for closed fracture: Secondary | ICD-10-CM | POA: Diagnosis not present

## 2021-08-21 DIAGNOSIS — Y93E1 Activity, personal bathing and showering: Secondary | ICD-10-CM | POA: Diagnosis not present

## 2021-08-21 DIAGNOSIS — S82832A Other fracture of upper and lower end of left fibula, initial encounter for closed fracture: Secondary | ICD-10-CM | POA: Diagnosis not present

## 2021-08-21 DIAGNOSIS — Z789 Other specified health status: Secondary | ICD-10-CM | POA: Diagnosis not present

## 2021-08-28 DIAGNOSIS — R2681 Unsteadiness on feet: Secondary | ICD-10-CM | POA: Diagnosis not present

## 2021-08-28 DIAGNOSIS — R198 Other specified symptoms and signs involving the digestive system and abdomen: Secondary | ICD-10-CM | POA: Diagnosis not present

## 2021-08-28 DIAGNOSIS — N3090 Cystitis, unspecified without hematuria: Secondary | ICD-10-CM | POA: Diagnosis not present

## 2021-08-28 DIAGNOSIS — F32A Depression, unspecified: Secondary | ICD-10-CM | POA: Diagnosis not present

## 2021-08-28 DIAGNOSIS — F22 Delusional disorders: Secondary | ICD-10-CM | POA: Diagnosis not present

## 2021-08-28 DIAGNOSIS — M79672 Pain in left foot: Secondary | ICD-10-CM | POA: Diagnosis not present

## 2021-08-28 DIAGNOSIS — F411 Generalized anxiety disorder: Secondary | ICD-10-CM | POA: Diagnosis not present

## 2021-08-28 DIAGNOSIS — Z7901 Long term (current) use of anticoagulants: Secondary | ICD-10-CM | POA: Diagnosis not present

## 2021-08-28 DIAGNOSIS — F4310 Post-traumatic stress disorder, unspecified: Secondary | ICD-10-CM | POA: Diagnosis not present

## 2021-08-28 DIAGNOSIS — S92309A Fracture of unspecified metatarsal bone(s), unspecified foot, initial encounter for closed fracture: Secondary | ICD-10-CM | POA: Diagnosis not present

## 2021-08-28 DIAGNOSIS — R5381 Other malaise: Secondary | ICD-10-CM | POA: Diagnosis not present

## 2021-08-28 DIAGNOSIS — F331 Major depressive disorder, recurrent, moderate: Secondary | ICD-10-CM | POA: Diagnosis not present

## 2021-08-28 DIAGNOSIS — S82892D Other fracture of left lower leg, subsequent encounter for closed fracture with routine healing: Secondary | ICD-10-CM | POA: Diagnosis not present

## 2021-08-28 DIAGNOSIS — S82852A Displaced trimalleolar fracture of left lower leg, initial encounter for closed fracture: Secondary | ICD-10-CM | POA: Diagnosis not present

## 2021-08-28 DIAGNOSIS — R1311 Dysphagia, oral phase: Secondary | ICD-10-CM | POA: Diagnosis not present

## 2021-08-28 DIAGNOSIS — Z741 Need for assistance with personal care: Secondary | ICD-10-CM | POA: Diagnosis not present

## 2021-08-28 DIAGNOSIS — M6281 Muscle weakness (generalized): Secondary | ICD-10-CM | POA: Diagnosis not present

## 2021-08-28 DIAGNOSIS — S82892A Other fracture of left lower leg, initial encounter for closed fracture: Secondary | ICD-10-CM | POA: Diagnosis not present

## 2021-08-28 DIAGNOSIS — Z889 Allergy status to unspecified drugs, medicaments and biological substances status: Secondary | ICD-10-CM | POA: Diagnosis not present

## 2021-08-28 DIAGNOSIS — S92302D Fracture of unspecified metatarsal bone(s), left foot, subsequent encounter for fracture with routine healing: Secondary | ICD-10-CM | POA: Diagnosis not present

## 2021-08-28 DIAGNOSIS — K449 Diaphragmatic hernia without obstruction or gangrene: Secondary | ICD-10-CM | POA: Diagnosis not present

## 2021-08-28 DIAGNOSIS — R41841 Cognitive communication deficit: Secondary | ICD-10-CM | POA: Diagnosis not present

## 2021-08-28 DIAGNOSIS — D649 Anemia, unspecified: Secondary | ICD-10-CM | POA: Diagnosis not present

## 2021-08-28 DIAGNOSIS — R262 Difficulty in walking, not elsewhere classified: Secondary | ICD-10-CM | POA: Diagnosis not present

## 2021-08-28 DIAGNOSIS — I4891 Unspecified atrial fibrillation: Secondary | ICD-10-CM | POA: Diagnosis not present

## 2021-08-28 DIAGNOSIS — F323 Major depressive disorder, single episode, severe with psychotic features: Secondary | ICD-10-CM | POA: Diagnosis not present

## 2021-08-28 DIAGNOSIS — F5102 Adjustment insomnia: Secondary | ICD-10-CM | POA: Diagnosis not present

## 2021-08-28 DIAGNOSIS — I10 Essential (primary) hypertension: Secondary | ICD-10-CM | POA: Diagnosis not present

## 2021-08-28 DIAGNOSIS — M25572 Pain in left ankle and joints of left foot: Secondary | ICD-10-CM | POA: Diagnosis not present

## 2021-08-28 DIAGNOSIS — E119 Type 2 diabetes mellitus without complications: Secondary | ICD-10-CM | POA: Diagnosis not present

## 2021-08-28 DIAGNOSIS — Z9181 History of falling: Secondary | ICD-10-CM | POA: Diagnosis not present

## 2021-08-28 DIAGNOSIS — R2689 Other abnormalities of gait and mobility: Secondary | ICD-10-CM | POA: Diagnosis not present

## 2021-08-28 DIAGNOSIS — N390 Urinary tract infection, site not specified: Secondary | ICD-10-CM | POA: Diagnosis not present

## 2021-08-31 DIAGNOSIS — S82892A Other fracture of left lower leg, initial encounter for closed fracture: Secondary | ICD-10-CM | POA: Diagnosis not present

## 2021-08-31 DIAGNOSIS — Z889 Allergy status to unspecified drugs, medicaments and biological substances status: Secondary | ICD-10-CM | POA: Diagnosis not present

## 2021-08-31 DIAGNOSIS — I10 Essential (primary) hypertension: Secondary | ICD-10-CM | POA: Diagnosis not present

## 2021-08-31 DIAGNOSIS — F323 Major depressive disorder, single episode, severe with psychotic features: Secondary | ICD-10-CM | POA: Diagnosis not present

## 2021-08-31 DIAGNOSIS — K449 Diaphragmatic hernia without obstruction or gangrene: Secondary | ICD-10-CM | POA: Diagnosis not present

## 2021-08-31 DIAGNOSIS — S92309A Fracture of unspecified metatarsal bone(s), unspecified foot, initial encounter for closed fracture: Secondary | ICD-10-CM | POA: Diagnosis not present

## 2021-08-31 DIAGNOSIS — I4891 Unspecified atrial fibrillation: Secondary | ICD-10-CM | POA: Diagnosis not present

## 2021-08-31 DIAGNOSIS — F22 Delusional disorders: Secondary | ICD-10-CM | POA: Diagnosis not present

## 2021-08-31 DIAGNOSIS — R198 Other specified symptoms and signs involving the digestive system and abdomen: Secondary | ICD-10-CM | POA: Diagnosis not present

## 2021-09-06 ENCOUNTER — Telehealth (HOSPITAL_COMMUNITY): Payer: Self-pay | Admitting: Physician Assistant

## 2021-09-07 ENCOUNTER — Ambulatory Visit (HOSPITAL_COMMUNITY): Payer: Medicare PPO | Admitting: Physician Assistant

## 2021-09-07 DIAGNOSIS — S82892A Other fracture of left lower leg, initial encounter for closed fracture: Secondary | ICD-10-CM | POA: Diagnosis not present

## 2021-09-07 DIAGNOSIS — R5381 Other malaise: Secondary | ICD-10-CM | POA: Diagnosis not present

## 2021-09-07 DIAGNOSIS — R2681 Unsteadiness on feet: Secondary | ICD-10-CM | POA: Diagnosis not present

## 2021-09-07 DIAGNOSIS — Z9181 History of falling: Secondary | ICD-10-CM | POA: Diagnosis not present

## 2021-09-07 DIAGNOSIS — F411 Generalized anxiety disorder: Secondary | ICD-10-CM | POA: Diagnosis not present

## 2021-09-07 DIAGNOSIS — S82892D Other fracture of left lower leg, subsequent encounter for closed fracture with routine healing: Secondary | ICD-10-CM | POA: Diagnosis not present

## 2021-09-07 DIAGNOSIS — I4891 Unspecified atrial fibrillation: Secondary | ICD-10-CM | POA: Diagnosis not present

## 2021-09-08 DIAGNOSIS — R2681 Unsteadiness on feet: Secondary | ICD-10-CM | POA: Diagnosis not present

## 2021-09-08 DIAGNOSIS — S92302D Fracture of unspecified metatarsal bone(s), left foot, subsequent encounter for fracture with routine healing: Secondary | ICD-10-CM | POA: Diagnosis not present

## 2021-09-08 DIAGNOSIS — F411 Generalized anxiety disorder: Secondary | ICD-10-CM | POA: Diagnosis not present

## 2021-09-08 DIAGNOSIS — R5381 Other malaise: Secondary | ICD-10-CM | POA: Diagnosis not present

## 2021-09-08 DIAGNOSIS — Z7901 Long term (current) use of anticoagulants: Secondary | ICD-10-CM | POA: Diagnosis not present

## 2021-09-08 DIAGNOSIS — R198 Other specified symptoms and signs involving the digestive system and abdomen: Secondary | ICD-10-CM | POA: Diagnosis not present

## 2021-09-08 DIAGNOSIS — I10 Essential (primary) hypertension: Secondary | ICD-10-CM | POA: Diagnosis not present

## 2021-09-08 DIAGNOSIS — D649 Anemia, unspecified: Secondary | ICD-10-CM | POA: Diagnosis not present

## 2021-09-08 DIAGNOSIS — S82892A Other fracture of left lower leg, initial encounter for closed fracture: Secondary | ICD-10-CM | POA: Diagnosis not present

## 2021-09-08 DIAGNOSIS — Z9181 History of falling: Secondary | ICD-10-CM | POA: Diagnosis not present

## 2021-09-08 DIAGNOSIS — I4891 Unspecified atrial fibrillation: Secondary | ICD-10-CM | POA: Diagnosis not present

## 2021-09-08 DIAGNOSIS — F22 Delusional disorders: Secondary | ICD-10-CM | POA: Diagnosis not present

## 2021-09-08 DIAGNOSIS — N3090 Cystitis, unspecified without hematuria: Secondary | ICD-10-CM | POA: Diagnosis not present

## 2021-09-08 DIAGNOSIS — S82892D Other fracture of left lower leg, subsequent encounter for closed fracture with routine healing: Secondary | ICD-10-CM | POA: Diagnosis not present

## 2021-09-09 DIAGNOSIS — R2681 Unsteadiness on feet: Secondary | ICD-10-CM | POA: Diagnosis not present

## 2021-09-09 DIAGNOSIS — I4891 Unspecified atrial fibrillation: Secondary | ICD-10-CM | POA: Diagnosis not present

## 2021-09-09 DIAGNOSIS — S82892D Other fracture of left lower leg, subsequent encounter for closed fracture with routine healing: Secondary | ICD-10-CM | POA: Diagnosis not present

## 2021-09-09 DIAGNOSIS — Z9181 History of falling: Secondary | ICD-10-CM | POA: Diagnosis not present

## 2021-09-09 DIAGNOSIS — R5381 Other malaise: Secondary | ICD-10-CM | POA: Diagnosis not present

## 2021-09-09 DIAGNOSIS — S82892A Other fracture of left lower leg, initial encounter for closed fracture: Secondary | ICD-10-CM | POA: Diagnosis not present

## 2021-09-09 DIAGNOSIS — F411 Generalized anxiety disorder: Secondary | ICD-10-CM | POA: Diagnosis not present

## 2021-09-15 DIAGNOSIS — M79672 Pain in left foot: Secondary | ICD-10-CM | POA: Diagnosis not present

## 2021-09-15 DIAGNOSIS — S82852A Displaced trimalleolar fracture of left lower leg, initial encounter for closed fracture: Secondary | ICD-10-CM | POA: Insufficient documentation

## 2021-09-15 DIAGNOSIS — M25572 Pain in left ankle and joints of left foot: Secondary | ICD-10-CM | POA: Diagnosis not present

## 2021-09-16 ENCOUNTER — Ambulatory Visit (HOSPITAL_COMMUNITY): Payer: Medicare PPO | Admitting: Physician Assistant

## 2021-09-17 DIAGNOSIS — I4891 Unspecified atrial fibrillation: Secondary | ICD-10-CM | POA: Diagnosis not present

## 2021-09-17 DIAGNOSIS — D649 Anemia, unspecified: Secondary | ICD-10-CM | POA: Diagnosis not present

## 2021-09-17 DIAGNOSIS — I10 Essential (primary) hypertension: Secondary | ICD-10-CM | POA: Diagnosis not present

## 2021-09-17 DIAGNOSIS — S92302D Fracture of unspecified metatarsal bone(s), left foot, subsequent encounter for fracture with routine healing: Secondary | ICD-10-CM | POA: Diagnosis not present

## 2021-09-17 DIAGNOSIS — N3090 Cystitis, unspecified without hematuria: Secondary | ICD-10-CM | POA: Diagnosis not present

## 2021-09-17 DIAGNOSIS — S82892D Other fracture of left lower leg, subsequent encounter for closed fracture with routine healing: Secondary | ICD-10-CM | POA: Diagnosis not present

## 2021-09-17 DIAGNOSIS — F32A Depression, unspecified: Secondary | ICD-10-CM | POA: Diagnosis not present

## 2021-09-21 ENCOUNTER — Other Ambulatory Visit: Payer: Self-pay | Admitting: Internal Medicine

## 2021-09-21 DIAGNOSIS — S8292XA Unspecified fracture of left lower leg, initial encounter for closed fracture: Secondary | ICD-10-CM

## 2021-09-24 ENCOUNTER — Ambulatory Visit (HOSPITAL_COMMUNITY): Payer: Medicare PPO | Admitting: Physician Assistant

## 2021-09-27 DIAGNOSIS — R309 Painful micturition, unspecified: Secondary | ICD-10-CM | POA: Diagnosis not present

## 2021-09-27 DIAGNOSIS — N3090 Cystitis, unspecified without hematuria: Secondary | ICD-10-CM | POA: Diagnosis not present

## 2021-09-27 DIAGNOSIS — R300 Dysuria: Secondary | ICD-10-CM | POA: Diagnosis not present

## 2021-09-28 ENCOUNTER — Ambulatory Visit (HOSPITAL_COMMUNITY): Payer: Medicare PPO | Admitting: Physician Assistant

## 2021-09-28 ENCOUNTER — Ambulatory Visit (HOSPITAL_COMMUNITY): Payer: Medicare PPO | Admitting: Nurse Practitioner

## 2021-09-28 DIAGNOSIS — R300 Dysuria: Secondary | ICD-10-CM | POA: Diagnosis not present

## 2021-09-29 ENCOUNTER — Ambulatory Visit (HOSPITAL_COMMUNITY): Payer: Medicare PPO | Admitting: Physician Assistant

## 2021-10-05 ENCOUNTER — Other Ambulatory Visit: Payer: Self-pay

## 2021-10-05 ENCOUNTER — Other Ambulatory Visit: Payer: Self-pay | Admitting: Cardiology

## 2021-10-06 ENCOUNTER — Encounter (HOSPITAL_COMMUNITY): Payer: Self-pay | Admitting: Nurse Practitioner

## 2021-10-06 ENCOUNTER — Ambulatory Visit (HOSPITAL_COMMUNITY)
Admission: RE | Admit: 2021-10-06 | Discharge: 2021-10-06 | Disposition: A | Discharge status: Home / Self Care | Payer: Medicare PPO | Source: Ambulatory Visit | Attending: Nurse Practitioner | Admitting: Nurse Practitioner

## 2021-10-06 VITALS — BP 140/80 | HR 54 | Ht 65.5 in | Wt 185.0 lb

## 2021-10-06 DIAGNOSIS — I10 Essential (primary) hypertension: Secondary | ICD-10-CM | POA: Diagnosis not present

## 2021-10-06 DIAGNOSIS — D6869 Other thrombophilia: Secondary | ICD-10-CM | POA: Insufficient documentation

## 2021-10-06 DIAGNOSIS — R0683 Snoring: Secondary | ICD-10-CM | POA: Insufficient documentation

## 2021-10-06 DIAGNOSIS — Z79899 Other long term (current) drug therapy: Secondary | ICD-10-CM | POA: Diagnosis not present

## 2021-10-06 DIAGNOSIS — I493 Ventricular premature depolarization: Secondary | ICD-10-CM | POA: Diagnosis not present

## 2021-10-06 DIAGNOSIS — I4819 Other persistent atrial fibrillation: Secondary | ICD-10-CM | POA: Diagnosis not present

## 2021-10-06 DIAGNOSIS — Z7901 Long term (current) use of anticoagulants: Secondary | ICD-10-CM | POA: Diagnosis not present

## 2021-10-06 LAB — COMPREHENSIVE METABOLIC PANEL
ALT: 19 U/L (ref 0–44)
AST: 21 U/L (ref 15–41)
Albumin: 3.7 g/dL (ref 3.5–5.0)
Alkaline Phosphatase: 65 U/L (ref 38–126)
Anion gap: 7 (ref 5–15)
BUN: 14 mg/dL (ref 8–23)
CO2: 26 mmol/L (ref 22–32)
Calcium: 9.1 mg/dL (ref 8.9–10.3)
Chloride: 102 mmol/L (ref 98–111)
Creatinine, Ser: 0.91 mg/dL (ref 0.44–1.00)
GFR, Estimated: >60 mL/min (ref >60)
Glucose, Bld: 125 mg/dL — ABNORMAL HIGH (ref 70–99)
Potassium: 3.8 mmol/L (ref 3.5–5.1)
Sodium: 135 mmol/L (ref 135–145)
Total Bilirubin: 0.4 mg/dL (ref 0.3–1.2)
Total Protein: 6.3 g/dL — ABNORMAL LOW (ref 6.5–8.1)

## 2021-10-06 LAB — TSH: TSH: 3.105 u[IU]/mL (ref 0.350–4.500)

## 2021-10-06 MED ORDER — AMIODARONE HCL 200 MG PO TABS: 200.0000 mg | ORAL_TABLET | Freq: Every day | ORAL | 1 refills | Status: DC

## 2021-10-06 NOTE — Patient Instructions (Addendum)
Reduce amiodarone to '200mg'$  once a day  Stop tumeric

## 2021-10-06 NOTE — Progress Notes (Signed)
Primary Care Physician: [name], MD Primary Cardiologist: Dr [name] Primary Electrophysiologist: Dr Curt Bears  Referring Physician: Dr Beola Cord [name] is a 81 y.o. female with a history of PVCs, HTN, atrial fibrillation who presents for follow up in the [facility].  The patient was initially diagnosed with atrial fibrillation 06/17/21 after presenting to Dr Curt Bears office with symptoms of weakness, fatigue, and SOB. ECG showed rate controlled afib. Patient was started on Eliquis for a CHADS2VASC score of 4.  Patient had  cardioversion scheduled for 6/15. She missed a dose of eliquis so TEE/CV was ordered, but then ekg showed that she was in sinus rhythm  so procedure was cancelled. She is now here for f/u.   [name] Her ekg shows sinus brady with RBBB.   She reports that [place] with a left ankle fx that required 2 surgeries and was in rehab for awhile  in the area.  She now is home and is here today with her son. She anticipated wearing the cast another couple of weeks. She continues with eliquis 5 mg bid, states she was on Lovenox for a while in the hospital.  She is in [place]. She has been taking amio 200 mg bid since June and that will  be reduced to 200 mg daily today.    Today, she denies symptoms of palpitations, chest pain, orthopnea, PND, lower extremity edema, dizziness, presyncope, syncope, snoring, daytime somnolence, bleeding, or neurologic sequela. The patient is tolerating medications without difficulties and is otherwise without complaint today.    Atrial Fibrillation Risk Factors:  she does not have symptoms or diagnosis of sleep apnea. she does not have a history of rheumatic fever.   she has a BMI of Body mass index is 30.32 kg/m.[name] Filed Weights   10/06/21 1433  Weight: 83.9 kg     Family History  Problem Relation Age of Onset   Colon cancer Mother 91   Lung cancer Mother    Pancreatic  cancer Father    Congestive Heart Failure Father    Skin cancer Sister    Esophageal cancer Paternal Uncle    Stomach cancer Neg Hx    Colon polyps Neg Hx    Rectal cancer Neg Hx      Atrial Fibrillation Management history:  Previous antiarrhythmic drugs: flecainide Previous cardioversions: none Previous ablations: none CHADS2VASC score: 4 Anticoagulation history: Eliquis   Past Medical History:  Diagnosis Date   Allergy    Anemia    hx of years ago    Anxiety    Arthritis    Blood transfusion without reported diagnosis    after hip replacement 2013   Cataract    removed both eyes   Chronic kidney disease    hx of uti    Colon polyp    Depression    DJD (degenerative joint disease)    GERD (gastroesophageal reflux disease)    Hypertension    Lumbar radiculopathy    S/P ESI- Dr Maxie Better Dr. Nelva Bush   Neuromuscular disorder ([place])    numbness in middle toes on left leg    Osteoporosis    Pneumonia    hx of    RBBB    Seasonal allergies    Syncope and collapse    in the setting of diarrheal illness-Dr turner   Past Surgical History:  Procedure Laterality Date   BACK SURGERY  2006  CARDIAC CATHETERIZATION  2003   Normal coronary arteries   COLONOSCOPY     POLYPECTOMY     [place]   TOTAL HIP ARTHROPLASTY  2008   right   TOTAL HIP ARTHROPLASTY  12/16/2011   Procedure: TOTAL HIP ARTHROPLASTY;  Surgeon: [name], MD;  Location: [facility];  Service: Orthopedics;  Laterality: Left;   VAGINAL HYSTERECTOMY  1981   WISDOM TOOTH EXTRACTION      Current Outpatient Medications  Medication Sig Dispense Refill   acetaminophen (TYLENOL) 500 MG tablet Taking two tablets by mouth in the am and two tablets at bedtime     amiodarone (PACERONE) 200 MG tablet Take 1 tablet by mouth twice a day for 1 month then reduce to 1 tablet daily. Appointment Required For Further Refills [phone] 60 tablet 0   apixaban (ELIQUIS) 5 MG TABS tablet Take 1 tablet (5 mg  total) by mouth 2 (two) times daily. 180 tablet 1   buPROPion (WELLBUTRIN XL) 150 MG 24 hr tablet Take 150 mg by mouth every morning.     clotrimazole (LOTRIMIN) 1 % cream Apply 1 application  topically 2 (two) times daily as needed (skin fold (yeast prevention)).     conjugated estrogens (PREMARIN) vaginal cream Place 1 Applicatorful vaginally once a week.      hydrochlorothiazide (HYDRODIURIL) 25 MG tablet TAKE ONE TABLET BY MOUTH DAILY 90 tablet 1   HYDROcodone-acetaminophen (NORCO/VICODIN) 5-325 MG per tablet Take 0.5 tablets by mouth daily as needed for severe pain.     lidocaine (XYLOCAINE) 1 % (with preservative) injection SMARTSIG:Rectally     LORazepam (ATIVAN) 1 MG tablet Take 1 mg by mouth at bedtime. scheduled     losartan (COZAAR) 100 MG tablet Take 100 mg by mouth daily.     metoprolol succinate (TOPROL-XL) 50 MG 24 hr tablet TAKE ONE TABLET BY MOUTH AT BEDTIME 90 tablet 3   nitrofurantoin, macrocrystal-monohydrate, (MACROBID) 100 MG capsule Take 100 mg by mouth 2 (two) times daily.     PARoxetine (PAXIL) 40 MG tablet Take 40 mg by mouth daily before breakfast.      potassium chloride SA (KLOR-CON M) 20 MEQ tablet TAKE 1 TABLET BY MOUTH TWICE DAILY. 180 tablet 1   traMADol (ULTRAM) 50 MG tablet Take 50 mg by mouth every 8 (eight) hours as needed.     TURMERIC-GINGER PO Take 1 capsule by mouth in the morning.     b complex vitamins capsule Take 1 capsule by mouth in the morning. (Patient not taking: Reported on 10/06/2021)     Calcium Carbonate-Vitamin D (CALCIUM-D PO) Take 1-2 tablets by mouth See admin instructions. Take 1 tablet in the morning & take 2 tablets at night. (Patient not taking: Reported on 10/06/2021)     fish oil-omega-3 fatty acids 1000 MG capsule Take 1 g by mouth every evening. (Patient not taking: Reported on 10/06/2021)     Glucosamine HCl (GLUCOSAMINE PO) Take 1 capsule by mouth every evening. (Patient not taking: Reported on 10/06/2021)     loratadine (CLARITIN) 10  MG tablet Take 10 mg by mouth every evening. (Patient not taking: Reported on 10/06/2021)     Multiple Vitamin (MULTIVITAMIN) tablet Take 1 tablet by mouth daily. (Patient not taking: Reported on 10/06/2021)     olmesartan (BENICAR) 40 MG tablet Take 1 tablet (40 mg total) by mouth daily. Please keep upcoming appt with Dr. Curt Bears in May 2023 before anymore refills. Thank you Final attempt (Patient not taking:  Reported on 10/06/2021) 90 tablet 1   vitamin C (ASCORBIC ACID) 500 MG tablet Take 1,000 mg by mouth daily.  (Patient not taking: Reported on 10/06/2021)     Current Facility-Administered Medications  Medication Dose Route Frequency Provider Last Rate Last Admin   0.9 %  sodium chloride infusion  500 mL Intravenous Once [name], Kavitha V, MD        Allergies  Allergen Reactions   Bioflavonoids Other (See Comments)   Cephalexin Diarrhea   Ciprocin-Fluocin-Procin [Fluocinolone] Diarrhea   Citric Acid     Headache, swelling around eyes and mouth   Cleocin [Clindamycin Hcl] Diarrhea   Cranberry Hives   Iodine Other (See Comments)    Unknown reaction   Percocet [Oxycodone-Acetaminophen] Other (See Comments)    Caused Hallucinations   Septra [Sulfamethoxazole-Trimethoprim] Diarrhea    Social History   Socioeconomic History   Marital status: Widowed    Spouse name: Not on file   Number of children: Not on file   Years of education: Not on file   Highest education level: Not on file  Occupational History   Occupation: Retired Education officer, museum  Tobacco Use   Smoking status: Never   Smokeless tobacco: Never   Tobacco comments:    Never smoke 07/08/21  Vaping Use   Vaping Use: Never used  Substance and Sexual Activity   Alcohol use: No   Drug use: No   Sexual activity: Not on file  Other Topics Concern   Not on file  Social History Narrative   Widowed.  Lived with a companion prior to going to [facility] for rehab.   Social Determinants of Health   Financial Resource  Strain: Not on file  Food Insecurity: Not on file  Transportation Needs: Not on file  Physical Activity: Not on file  Stress: Not on file  Social Connections: Not on file  Intimate Partner Violence: Not on file     ROS- All systems are reviewed and negative except as per the HPI above.  Physical Exam: Vitals:   10/06/21 1433  Weight: 83.9 kg  Height: 5' 5.5" (1.664 m)     GEN- The patient is a well appearing  female, alert and oriented x 3 today.   HEENT-head normocephalic, atraumatic, sclera clear, conjunctiva pink, hearing intact, trachea midline. Lungs- Clear to ausculation bilaterally, normal work of breathing Heart- Regular rate and rhythm, no murmurs, rubs or gallops  GI- soft, NT, ND, + BS Extremities- no clubbing, cyanosis, or edema MS- no significant deformity or atrophy Skin- no rash or lesion Psych- euthymic mood, full affect Neuro- strength and sensation are intact   Wt Readings from Last 3 Encounters:  10/06/21 83.9 kg  07/08/21 79.4 kg  06/17/21 83 kg    EKG today demonstrates  Vent. rate 54 BPM PR interval 200 ms QRS duration 154 ms QT/QTcB 558/529 ms P-R-T axes 51 113 -10 Sinus bradycardia Right bundle branch block Left posterior fascicular block  Bifascicular block  T wave abnormality, consider inferior ischemia Abnormal ECG When compared with ECG of 29-Jul-2021 10:13, PREVIOUS ECG IS PRESENT  Echo 01/25/18 demonstrated  Left ventricle: The cavity size was normal. Wall thickness was    normal. Systolic function was normal. The estimated ejection    fraction was in the range of 50% to 55%. Wall motion was normal;    there were no regional wall motion abnormalities.  - Mitral valve: There was mild regurgitation.  Epic records are reviewed at length today  CHA2DS2-VASc  Score = 4  The patient's score is based upon: CHF History: 0 HTN History: 1 Diabetes History: 0 Stroke History: 0 Vascular Disease History: 0 Age Score: 2 Gender  Score: 1       ASSESSMENT AND PLAN: 1. Persistent Atrial Fibrillation (ICD10:  I48.19) The patient's CHA2DS2-VASc score is 4, indicating a 4.8% annual risk of stroke.   In SR Decrease amiodarone  to 200 mg daily, pt states that she has been on amiodarone 200 mg bid since June, although rx did not read this way Tsh/cmet today  Continue Eliquis 5 mg BID Continue Toprol 50 mg daily  2. Secondary Hypercoagulable State (ICD10:  D68.69) The patient is at significant risk for stroke/thromboembolism based upon her CHA2DS2-VASc Score of 4.  Continue Apixaban (Eliquis).   3. HTN Stable, no changes today.  4. PVCs Continue BB and amiodarone as above.  5. FX left ankle 08/21/21 Per ortho   6. Witnessed snoring Family is requesting home sleep study   Follow up in 6 months with Dr. Gaynelle Cage C. Callen Zuba, [facility] [address] [place], [place] [address] [phone]

## 2021-10-08 ENCOUNTER — Telehealth: Payer: Self-pay | Admitting: *Deleted

## 2021-10-08 NOTE — Telephone Encounter (Signed)
Prior Authorization for HOME SLEEP TEST sent to [facility] via Fax .   NO PA REQ

## 2021-10-08 NOTE — Telephone Encounter (Signed)
I believe this is mean for the A-fib clinic

## 2021-10-11 NOTE — Telephone Encounter (Signed)
D/w Rushie Goltz, RN and Gershon Cull, CMA. This type of sleep study Gae Bon sets up with the sleep lab. This is not an Jaynie Crumble that was being ordered.

## 2021-10-12 ENCOUNTER — Other Ambulatory Visit: Payer: Self-pay | Admitting: Cardiology

## 2021-10-14 ENCOUNTER — Ambulatory Visit (HOSPITAL_COMMUNITY): Payer: Medicare PPO | Admitting: Physician Assistant

## 2021-10-15 DIAGNOSIS — S82852A Displaced trimalleolar fracture of left lower leg, initial encounter for closed fracture: Secondary | ICD-10-CM | POA: Diagnosis not present

## 2021-10-21 DIAGNOSIS — R5383 Other fatigue: Secondary | ICD-10-CM | POA: Diagnosis not present

## 2021-10-29 ENCOUNTER — Other Ambulatory Visit: Payer: Self-pay

## 2021-10-29 MED ORDER — LOSARTAN POTASSIUM 100 MG PO TABS: 100.0000 mg | ORAL_TABLET | Freq: Every day | ORAL | 2 refills | Status: DC

## 2021-11-24 DIAGNOSIS — S82852A Displaced trimalleolar fracture of left lower leg, initial encounter for closed fracture: Secondary | ICD-10-CM | POA: Diagnosis not present

## 2021-12-24 DIAGNOSIS — S82852A Displaced trimalleolar fracture of left lower leg, initial encounter for closed fracture: Secondary | ICD-10-CM | POA: Diagnosis not present

## 2021-12-24 DIAGNOSIS — S82852D Displaced trimalleolar fracture of left lower leg, subsequent encounter for closed fracture with routine healing: Secondary | ICD-10-CM | POA: Diagnosis not present

## 2021-12-28 DIAGNOSIS — N390 Urinary tract infection, site not specified: Secondary | ICD-10-CM | POA: Diagnosis not present

## 2022-01-13 ENCOUNTER — Encounter: Payer: Self-pay | Admitting: Gastroenterology

## 2022-01-13 DIAGNOSIS — N390 Urinary tract infection, site not specified: Secondary | ICD-10-CM | POA: Diagnosis not present

## 2022-01-13 DIAGNOSIS — N898 Other specified noninflammatory disorders of vagina: Secondary | ICD-10-CM | POA: Diagnosis not present

## 2022-01-13 DIAGNOSIS — R300 Dysuria: Secondary | ICD-10-CM | POA: Diagnosis not present

## 2022-01-13 DIAGNOSIS — R42 Dizziness and giddiness: Secondary | ICD-10-CM | POA: Diagnosis not present

## 2022-01-13 DIAGNOSIS — I4819 Other persistent atrial fibrillation: Secondary | ICD-10-CM | POA: Diagnosis not present

## 2022-01-17 ENCOUNTER — Telehealth: Payer: Self-pay | Admitting: Cardiology

## 2022-01-17 MED ORDER — METOPROLOL SUCCINATE ER 50 MG PO TB24: 25.0000 mg | ORAL_TABLET | Freq: Every day | ORAL | 3 refills | Status: DC

## 2022-01-17 NOTE — Telephone Encounter (Signed)
STAT if HR is under 50 or over 120 (normal HR is 60-100 beats per minute)  What is your heart rate? 47 -- Has been consistently reading under 50   Do you have a log of your heart rate readings (document readings)?   Do you have any other symptoms?  Dizzy.

## 2022-01-17 NOTE — Telephone Encounter (Signed)
Left message informing son that I would try to reach pt to give her MD recommendations. Unable to leave a message on pts home number.

## 2022-01-17 NOTE — Telephone Encounter (Signed)
Spoke with pt's son re pt  and pt's family has noted low HR 50 for over a week and  at neuro visit on Friday  HR was 70 Per son pt has also  noted some dizziness Will forward to Dr Curt Bears for review and recommendations./cy

## 2022-01-17 NOTE — Telephone Encounter (Signed)
Patient's son is returning call.

## 2022-01-17 NOTE — Telephone Encounter (Signed)
Spoke to pt and her son. Advised to decrease Toprol to 25 mg once daily to see if improvement in HRs/dizziness. Advised to let me know by Thursday if no improvement. Patient verbalized understanding and agreeable to plan.

## 2022-02-02 ENCOUNTER — Other Ambulatory Visit: Payer: Self-pay | Admitting: Cardiology

## 2022-02-02 ENCOUNTER — Telehealth: Payer: Self-pay | Admitting: Cardiology

## 2022-02-02 DIAGNOSIS — I4819 Other persistent atrial fibrillation: Secondary | ICD-10-CM

## 2022-02-02 NOTE — Telephone Encounter (Signed)
Pt c/o medication issue:  1. Name of Medication: Metoprolol 25 mg  2. How are you currently taking this medication (dosage and times per day)? 1 time a day at night  3. Are you having a reaction (difficulty breathing--STAT)?   4. What is your medication issue? Diarrhea, cold sweat, felt like she was going to pass out

## 2022-02-02 NOTE — Telephone Encounter (Signed)
I spoke with patient. Her Toprol was decreased on 01/17/22 due to low heart rate and dizziness.  Patient reports she is no longer having dizziness.  Yesterday she did not feel well and had diarrhea.  She is on a long term antibiotic and it is not unusual for her to have diarrhea.  Patient reports her heart rate is running 47-50 most of the time.  Current heart rate is 58 but this is higher than normal.  Per last note if symptoms do not improve patient can stop metoprolol.  As she is continuing to have heart rate in the 40's patient advised to stop Toprol.  She will continue to monitor heart rate and let us know if it stays below 50.

## 2022-02-03 NOTE — Telephone Encounter (Signed)
Prescription refill request for Eliquis received. Indication: Afib  Last office visit: 10/06/21 [name])  Scr: 0.91 (10/06/21)  Age: 81 Weight: 83.9kg  Appropriate dose and refill sent to requested pharmacy.

## 2022-02-03 NOTE — Telephone Encounter (Signed)
Pt advised to stop Metoprolol, per Dr. Curt Bears. Advised to call office if she experiences elevated HRs. Patient verbalized understanding and agreeable to plan.

## 2022-03-23 ENCOUNTER — Other Ambulatory Visit: Payer: Self-pay | Admitting: Cardiology

## 2022-03-24 ENCOUNTER — Encounter (HOSPITAL_COMMUNITY): Payer: Self-pay | Admitting: *Deleted

## 2022-04-04 ENCOUNTER — Other Ambulatory Visit: Payer: Self-pay | Admitting: Cardiology

## 2022-04-28 DIAGNOSIS — M25561 Pain in right knee: Secondary | ICD-10-CM | POA: Diagnosis not present

## 2022-04-28 DIAGNOSIS — M1711 Unilateral primary osteoarthritis, right knee: Secondary | ICD-10-CM | POA: Diagnosis not present

## 2022-04-29 ENCOUNTER — Other Ambulatory Visit (HOSPITAL_COMMUNITY): Payer: Self-pay | Admitting: Nurse Practitioner

## 2022-05-03 ENCOUNTER — Telehealth: Payer: Self-pay | Admitting: Cardiology

## 2022-05-03 NOTE — Telephone Encounter (Signed)
Pt called to f/u on original call this morning due to not hearing anything yet. Please advise.

## 2022-05-03 NOTE — Telephone Encounter (Signed)
Pt c/o medication issue:  1. Name of Medication: Metoprolol 25 mg  2. How are you currently taking this medication (dosage and times per day)? No  3. Are you having a reaction (difficulty breathing--STAT)? No  4. What is your medication issue? Pt stated they stopped taking their metoprolol and now the pt is having some shaking. Pt stated that the shaking happens mainly in the morning and sometimes at night. Pt is unsure if any of her medications would be causing this issue.

## 2022-05-03 NOTE — Telephone Encounter (Signed)
Pt calling to f/u again. She states someone has called her twice, unsure who. Please advise.

## 2022-05-03 NOTE — Telephone Encounter (Signed)
Pt informed that I believe Amiodarone may be the culprit of reports symptoms. Reviewed w/ pharmD.  Pt aware forwarding to MD for medication switch advisement. She is agreeable to plan and understands I will call her w/ recommendation this week.

## 2022-05-06 ENCOUNTER — Telehealth: Payer: Self-pay | Admitting: Cardiology

## 2022-05-06 MED ORDER — AMIODARONE HCL 200 MG PO TABS: 100.0000 mg | ORAL_TABLET | Freq: Every day | ORAL | 3 refills | Status: DC

## 2022-05-06 NOTE — Telephone Encounter (Signed)
Called and spoke with patient, see phone note from 05/06/2022.

## 2022-05-06 NOTE — Telephone Encounter (Signed)
Returned call to patient.   Informed her of Dr. Macky Lower recommendation: Reduce amiodarone to 100 mg daily   Advised patient to call our office if no improvement in symptoms in 1-2 weeks. Patient verbalized understanding.  Medication list updated.

## 2022-05-06 NOTE — Telephone Encounter (Signed)
Patient called and wanted to know if Barbera Setters has given her a call yet

## 2022-05-17 ENCOUNTER — Telehealth: Payer: Self-pay | Admitting: Cardiology

## 2022-05-17 NOTE — Telephone Encounter (Signed)
Pt called stated around 3/19 she called reporting shaking and was advised to decrease amiodarone to half tablet. Call the office in 1-2 weeks if no improvement with symptoms. Pt stated she is still having episodes of shaking. Advised patient that Venida Jarvis is not in the office with week. Pt stated she will like for Sherri to call her when she returns. Will forward to MD and nurse.

## 2022-05-17 NOTE — Telephone Encounter (Signed)
Patient is calling requesting call back from [place], [place]. She says that [name] will know what she is calling about, but that she is not any better than she was previously.

## 2022-05-18 NOTE — Telephone Encounter (Signed)
  Stop amiodarone and see if shaking improves.  Needs a phone call in 2 weeks to reassess symptoms.  Order per Dr. Elliot Cousin. Received 05/18/2022.  Pt called and made aware of Dr. Curt Bears order to STOP Amiodarone today, 05/18/2022.  Pt also told that Dr. Curt Bears RN will follow up with her in 2 weeks to reassess her symptoms, and see how she is feeling.  Pt told to call us, and follow up if this call does not occur.  Pt verbalized understanding, and stated she will d/c use of the Amiodarone.    Follow up call required in 2 weeks for reassessment.      Amiodarone taken off Pt MAR

## 2022-06-03 NOTE — Telephone Encounter (Addendum)
Pt reports tremors have improved but not gone away completely. Discussed that if r/t Amiodarone, that this drug may still be lingering in her system.  Discussed that it can't take one/more months to completely leave.  Pt aware sending to scheduler to arrange OV w/ Dr. Elberta Fortis for further discussion/evaluation. Pt understands visit will be at least 2-4 weeks out. Pt agreeable to plan. She will call office if she has any issues w/ her AFib before follow up appt.

## 2022-06-10 DIAGNOSIS — Z9181 History of falling: Secondary | ICD-10-CM | POA: Diagnosis not present

## 2022-06-10 DIAGNOSIS — M179 Osteoarthritis of knee, unspecified: Secondary | ICD-10-CM | POA: Diagnosis not present

## 2022-06-10 DIAGNOSIS — E7800 Pure hypercholesterolemia, unspecified: Secondary | ICD-10-CM | POA: Diagnosis not present

## 2022-06-10 DIAGNOSIS — M5136 Other intervertebral disc degeneration, lumbar region: Secondary | ICD-10-CM | POA: Diagnosis not present

## 2022-06-10 DIAGNOSIS — N3020 Other chronic cystitis without hematuria: Secondary | ICD-10-CM | POA: Diagnosis not present

## 2022-06-10 DIAGNOSIS — Z23 Encounter for immunization: Secondary | ICD-10-CM | POA: Diagnosis not present

## 2022-06-10 DIAGNOSIS — N390 Urinary tract infection, site not specified: Secondary | ICD-10-CM | POA: Diagnosis not present

## 2022-06-10 DIAGNOSIS — I10 Essential (primary) hypertension: Secondary | ICD-10-CM | POA: Diagnosis not present

## 2022-06-10 DIAGNOSIS — I4819 Other persistent atrial fibrillation: Secondary | ICD-10-CM | POA: Diagnosis not present

## 2022-06-10 DIAGNOSIS — M169 Osteoarthritis of hip, unspecified: Secondary | ICD-10-CM | POA: Diagnosis not present

## 2022-06-10 DIAGNOSIS — R350 Frequency of micturition: Secondary | ICD-10-CM | POA: Diagnosis not present

## 2022-06-10 DIAGNOSIS — Z1331 Encounter for screening for depression: Secondary | ICD-10-CM | POA: Diagnosis not present

## 2022-06-10 DIAGNOSIS — J3089 Other allergic rhinitis: Secondary | ICD-10-CM | POA: Diagnosis not present

## 2022-06-10 DIAGNOSIS — Z0000 Encounter for general adult medical examination without abnormal findings: Secondary | ICD-10-CM | POA: Diagnosis not present

## 2022-06-10 LAB — LAB REPORT - SCANNED: EGFR: 62

## 2022-06-17 ENCOUNTER — Encounter: Payer: Self-pay | Admitting: Internal Medicine

## 2022-06-24 ENCOUNTER — Ambulatory Visit: Payer: Medicare PPO | Attending: Cardiology | Admitting: Cardiology

## 2022-06-24 ENCOUNTER — Encounter: Payer: Self-pay | Admitting: Cardiology

## 2022-06-24 VITALS — BP 110/68 | HR 97 | Ht 65.5 in | Wt 178.0 lb

## 2022-06-24 DIAGNOSIS — D6869 Other thrombophilia: Secondary | ICD-10-CM

## 2022-06-24 DIAGNOSIS — I480 Paroxysmal atrial fibrillation: Secondary | ICD-10-CM

## 2022-06-24 DIAGNOSIS — I493 Ventricular premature depolarization: Secondary | ICD-10-CM

## 2022-06-24 NOTE — Progress Notes (Signed)
Electrophysiology Office Note   Date:  06/24/2022   ID:  [name], [name] October 13, 1940, MRN [id number]  PCP:  Collene Mares, PA  Cardiologist:  Mayford Knife Primary Electrophysiologist:  Regan Lemming, MD    No chief complaint on file.    History of Present Illness: [name] is a 82 y.o. female who is being seen today for the evaluation of PVCs at the request of [name]. Presenting today for electrophysiology evaluation.    She has a history significant for hypertension and right bundle branch block.  She was noted to have PVCs and wore cardiac monitor that showed a 15% burden.  She was started on flecainide with reduction in her burden to less than 1%.  She unfortunately began having episodes of atrial fibrillation.  Flecainide was stopped and she was started on amiodarone.  She felt quite poorly on amiodarone with significant tremors.  She also had a fall requiring 2 ankle surgeries and a prolonged recovery.  She is walking with a walker due to ankle and knee pain.  Today, denies symptoms of palpitations, chest pain,   orthopnea, PND, lower extremity edema, claudication, dizziness, presyncope, syncope, bleeding, or neurologic sequela. The patient is tolerating medications without difficulties.  Today, she feels fatigued and mildly short of breath.  She is in atrial fibrillation.  She feels that she is in and out of atrial fibrillation over the last few weeks.  She stopped her amiodarone approximately 6 weeks ago.   Past Medical History:  Diagnosis Date   Allergy    Anemia    hx of years ago    Anxiety    Arthritis    Blood transfusion without reported diagnosis    after hip replacement 2013   Cataract    removed both eyes   Chronic kidney disease    hx of uti    Colon polyp    Depression    DJD (degenerative joint disease)    GERD (gastroesophageal reflux disease)    Hypertension    Lumbar radiculopathy    S/P ESI- Dr Jillyn Hidden Dr. Ethelene Hal   Neuromuscular  disorder (HCC)    numbness in middle toes on left leg    Osteoporosis    Pneumonia    hx of    RBBB    Seasonal allergies    Syncope and collapse    in the setting of diarrheal illness-Dr turner   Past Surgical History:  Procedure Laterality Date   BACK SURGERY  2006   CARDIAC CATHETERIZATION  2003   Normal coronary arteries   COLONOSCOPY     POLYPECTOMY     RECTOCELE REPAIR  1993   TOTAL HIP ARTHROPLASTY  2008   right   TOTAL HIP ARTHROPLASTY  12/16/2011   Procedure: TOTAL HIP ARTHROPLASTY;  Surgeon: Javier Docker, MD;  Location: [facility];  Service: Orthopedics;  Laterality: Left;   VAGINAL HYSTERECTOMY  1981   WISDOM TOOTH EXTRACTION       Current Outpatient Medications  Medication Sig Dispense Refill   acetaminophen (TYLENOL) 500 MG tablet Taking two tablets by mouth in the am and two tablets at bedtime     b complex vitamins capsule Take 1 capsule by mouth in the morning.     buPROPion (WELLBUTRIN XL) 150 MG 24 hr tablet Take 150 mg by mouth every morning.     Calcium Carbonate-Vitamin D (CALCIUM-D PO) Take 1-2 tablets by mouth See admin instructions. Take 1 tablet in the morning &  take 2 tablets at night.     clotrimazole (LOTRIMIN) 1 % cream Apply 1 application  topically 2 (two) times daily as needed (skin fold (yeast prevention)).     conjugated estrogens (PREMARIN) vaginal cream Place 1 Applicatorful vaginally once a week.      ELIQUIS 5 MG TABS tablet TAKE 1 TABLET TWICE DAILY 180 tablet 3   fish oil-omega-3 fatty acids 1000 MG capsule Take 1 g by mouth every evening.     Glucosamine HCl (GLUCOSAMINE PO) Take 1 capsule by mouth every evening.     hydrochlorothiazide (HYDRODIURIL) 25 MG tablet TAKE ONE TABLET BY MOUTH DAILY 90 tablet 1   HYDROcodone-acetaminophen (NORCO/VICODIN) 5-325 MG per tablet Take 0.5 tablets by mouth daily as needed for severe pain.     lidocaine (XYLOCAINE) 1 % (with preservative) injection SMARTSIG:Rectally     loratadine (CLARITIN) 10 MG  tablet Take 10 mg by mouth every evening.     LORazepam (ATIVAN) 1 MG tablet Take 1 mg by mouth at bedtime. scheduled     losartan (COZAAR) 100 MG tablet Take 1 tablet (100 mg total) by mouth daily. 90 tablet 2   Multiple Vitamin (MULTIVITAMIN) tablet Take 1 tablet by mouth daily.     nitrofurantoin, macrocrystal-monohydrate, (MACROBID) 100 MG capsule Take 100 mg by mouth 2 (two) times daily.     olmesartan (BENICAR) 40 MG tablet Take 1 tablet (40 mg total) by mouth daily. Please keep upcoming appt with Dr. Elberta Fortis in May 2023 before anymore refills. Thank you Final attempt 90 tablet 1   PARoxetine (PAXIL) 40 MG tablet Take 40 mg by mouth daily before breakfast.      potassium chloride SA (KLOR-CON M) 20 MEQ tablet TAKE ONE TABLET BY MOUTH TWICE DAILY 180 tablet 1   traMADol (ULTRAM) 50 MG tablet Take 50 mg by mouth every 8 (eight) hours as needed.     vitamin C (ASCORBIC ACID) 500 MG tablet Take 1,000 mg by mouth daily.     Current Facility-Administered Medications  Medication Dose Route Frequency Provider Last Rate Last Admin   0.9 %  sodium chloride infusion  500 mL Intravenous Once [name], Eleonore Chiquito, MD        Allergies:   Bioflavonoids, Cephalexin, Ciprocin-fluocin-procin [fluocinolone], Citric acid, Citrus bioflavonoid, Cleocin [clindamycin hcl], Cranberry, Iodine, Lemon flavor, Other, Percocet [oxycodone-acetaminophen], and Septra [sulfamethoxazole-trimethoprim]   Social History:  The patient  reports that she has never smoked. She has never used smokeless tobacco. She reports that she does not drink alcohol and does not use drugs.   Family History:  The patient's family history includes Colon cancer (age of onset: 9) in her mother; Congestive Heart Failure in her father; Esophageal cancer in her paternal uncle; Lung cancer in her mother; Pancreatic cancer in her father; Skin cancer in her sister.   ROS:  Please see the history of present illness.   Otherwise, review of systems is  positive for none.   All other systems are reviewed and negative.   PHYSICAL EXAM: VS:  BP 110/68   Pulse 97   Ht 5' 5.5" (1.664 m)   Wt 178 lb (80.7 kg)   SpO2 97%   BMI 29.17 kg/m  , BMI Body mass index is 29.17 kg/m. GEN: Well nourished, well developed, in no acute distress  HEENT: normal  Neck: no JVD, carotid bruits, or masses Cardiac: irregular; no murmurs, rubs, or gallops,no edema  Respiratory:  clear to auscultation bilaterally, normal work of breathing GI: soft,  nontender, nondistended, + BS MS: no deformity or atrophy  Skin: warm and dry Neuro:  Strength and sensation are intact Psych: euthymic mood, full affect  EKG:  EKG is ordered today. Personal review of the ekg ordered shows atrial fibrillation, right bundle branch block  Recent Labs: 07/29/2021: Hemoglobin 12.1; Platelets 231 10/06/2021: ALT 19; BUN 14; Creatinine, Ser 0.91; Potassium 3.8; Sodium 135; TSH 3.105    Lipid Panel  No results found for: "CHOL", "TRIG", "HDL", "CHOLHDL", "VLDL", "LDLCALC", "LDLDIRECT"   Wt Readings from Last 3 Encounters:  06/24/22 178 lb (80.7 kg)  10/06/21 185 lb (83.9 kg)  07/08/21 175 lb (79.4 kg)      Other studies Reviewed: Additional studies/ records that were reviewed today include: TTE 01/25/18  Review of the above records today demonstrates:  - Left ventricle: The cavity size was normal. Wall thickness was   normal. Systolic function was normal. The estimated ejection   fraction was in the range of 50% to 55%. Wall motion was normal;   there were no regional wall motion abnormalities. - Mitral valve: There was mild regurgitation.  SPECT 01/25/18 Nuclear stress EF: 57%. The left ventricular ejection fraction is normal (55-65%). There was no ST segment deviation noted during stress. The study is normal. This is a low risk study.   Cardiac monitor 08/03/2018 personally reviewed Max 114 bpm 03:23pm, 06/05 Min 45 bpm 08:59am, 06/05 Avg 54 bpm <1% PVCs and  PACs Sinus rhythm with episodes of sinus tachycardia and SVT 23 supraventricular runs longest 8 beats at 114 bpm  ASSESSMENT AND PLAN:  1.  PVCs: 15 % noted on cardiac monitor.  PVCs appear from the LV septum.  Amiodarone has been held as she was having tremors.  No PVCs on ECG today.  2.  Hypertension: Currently well-controlled  3.  Atrial fibrillation/atrial flutter: Currently on Eliquis.  CHA2DS2-VASc of 3.  In atrial fibrillation with right bundle branch block today.  She feels quite poorly with fatigue and shortness of breath.  She would benefit from a rhythm control strategy.  She would prefer to avoid antiarrhythmic medications.  Due to that, we [name] plan for ablation.  Risk and benefits have been discussed.  She understands the risks and is agreed to the procedure.  Risk, benefits, and alternatives to EP study and radiofrequency ablation for afib were also discussed in detail today. These risks include but are not limited to stroke, bleeding, vascular damage, tamponade, perforation, damage to the esophagus, lungs, and other structures, pulmonary vein stenosis, worsening renal function, and death. The patient understands these risk and wishes to proceed.  We [name] therefore proceed with catheter ablation at the next available time.  Carto, ICE, anesthesia are requested for the procedure.  [name] also obtain CT PV protocol prior to the procedure to exclude LAA thrombus and further evaluate atrial anatomy.   4.  Secondary hypercoagulable state: Currently on Eliquis for atrial fibrillation   Current medicines are reviewed at length with the patient today.   The patient does not have concerns regarding her medicines.  The following changes were made today: none  Labs/ tests ordered today include:  Orders Placed This Encounter  Procedures   EKG 12-Lead     Disposition:   FU 6 months  Signed, [name] Jorja Loa, MD  06/24/2022 5:10 PM     [facility] [address] [place] [address] [phone] (office) [phone] (fax)

## 2022-06-24 NOTE — Patient Instructions (Addendum)
Medication Instructions:  Your physician recommends that you continue on your current medications as directed. Please refer to the Current Medication list given to you today.  *If you need a refill on your cardiac medications before your next appointment, please call your pharmacy*   Lab Work: Pre procedure labs -- see procedure instruction letter:  BMP & CBC  If you have labs (blood work) drawn today and your tests are completely normal, you will receive your results only by: MyChart Message (if you have MyChart) OR A paper copy in the mail If you have any lab test that is abnormal or we need to change your treatment, we will call you to review the results.   Testing/Procedures: Your physician has requested that you have cardiac CT within 7 days PRIOR to your ablation. Cardiac computed tomography (CT) is a painless test that uses an x-ray machine to take clear, detailed pictures of your heart.  Please follow instruction below located under "other instructions". You will get a call from our office to schedule the date for this test.  Your physician has recommended that you have an ablation. Catheter ablation is a medical procedure used to treat some cardiac arrhythmias (irregular heartbeats). During catheter ablation, a long, thin, flexible tube is put into a blood vessel in your groin (upper thigh), or neck. This tube is called an ablation catheter. It is then guided to your heart through the blood vessel. Radio frequency waves destroy small areas of heart tissue where abnormal heartbeats may cause an arrhythmia to start. Please follow instruction letter given to you today.  You will be scheduled for 11/04/2022.  EP scheduler will reach out to [name], son, to go over instructions -- it may be awhile before she reaches out.    Follow-Up: At [facility], you and your health needs are our priority.  As part of our continuing mission to provide you with exceptional heart care, we have created  designated Provider Care Teams.  These Care Teams include your primary Cardiologist (physician) and Advanced Practice Providers (APPs -  Physician Assistants and Nurse Practitioners) who all work together to provide you with the care you need, when you need it.  Your next appointment:   1 month(s) after your ablation  The format for your next appointment:   In Person  Provider:   AFib clinic   Thank you for choosing [facility]!!   Sydney Horn, RN [phone]    Other Instructions   Cardiac Ablation Cardiac ablation is a procedure to destroy (ablate) some heart tissue that is sending bad signals. These bad signals cause problems in heart rhythm. The heart has many areas that make these signals. If there are problems in these areas, they can make the heart beat in a way that is not normal. Destroying some tissues can help make the heart rhythm normal. Tell your doctor about: Any allergies you have. All medicines you are taking. These include vitamins, herbs, eye drops, creams, and over-the-counter medicines. Any problems you or family members have had with medicines that make you fall asleep (anesthetics). Any blood disorders you have. Any surgeries you have had. Any medical conditions you have, such as kidney failure. Whether you are pregnant or may be pregnant. What are the risks? This is a safe procedure. But problems may occur, including: Infection. Bruising and bleeding. Bleeding into the chest. Stroke or blood clots. Damage to nearby areas of your body. Allergies to medicines or dyes. The need for a pacemaker if  the normal system is damaged. Failure of the procedure to treat the problem. What happens before the procedure? Medicines Ask your doctor about: Changing or stopping your normal medicines. This is important. Taking aspirin and ibuprofen. Do not take these medicines unless your doctor tells you to take them. Taking other medicines, vitamins, herbs,  and supplements. General instructions Follow instructions from your doctor about what you cannot eat or drink. Plan to have someone take you home from the hospital or clinic. If you will be going home right after the procedure, plan to have someone with you for 24 hours. Ask your doctor what steps will be taken to prevent infection. What happens during the procedure?  An IV tube will be put into one of your veins. You will be given a medicine to help you relax. The skin on your neck or groin will be numbed. A cut (incision) will be made in your neck or groin. A needle will be put through your cut and into a large vein. A tube (catheter) will be put into the needle. The tube will be moved to your heart. Dye may be put through the tube. This helps your doctor see your heart. Small devices (electrodes) on the tube will send out signals. A type of energy will be used to destroy some heart tissue. The tube will be taken out. Pressure will be held on your cut. This helps stop bleeding. A bandage will be put over your cut. The exact procedure may vary among doctors and hospitals. What happens after the procedure? You will be watched until you leave the hospital or clinic. This includes checking your heart rate, breathing rate, oxygen, and blood pressure. Your cut will be watched for bleeding. You will need to lie still for a few hours. Do not drive for 24 hours or as long as your doctor tells you. Summary Cardiac ablation is a procedure to destroy some heart tissue. This is done to treat heart rhythm problems. Tell your doctor about any medical conditions you may have. Tell him or her about all medicines you are taking to treat them. This is a safe procedure. But problems may occur. These include infection, bruising, bleeding, and damage to nearby areas of your body. Follow what your doctor tells you about food and drink. You may also be told to change or stop some of your medicines. After the  procedure, do not drive for 24 hours or as long as your doctor tells you. This information is not intended to replace advice given to you by your health care provider. Make sure you discuss any questions you have with your health care provider. Document Revised: 04/23/2021 Document Reviewed: 01/03/2019 Elsevier Patient Education  2023 [employer].

## 2022-06-29 ENCOUNTER — Other Ambulatory Visit: Payer: Self-pay

## 2022-06-29 DIAGNOSIS — I4819 Other persistent atrial fibrillation: Secondary | ICD-10-CM

## 2022-08-11 ENCOUNTER — Other Ambulatory Visit: Payer: Self-pay | Admitting: Cardiology

## 2022-09-14 ENCOUNTER — Telehealth: Payer: Self-pay

## 2022-09-14 DIAGNOSIS — I4819 Other persistent atrial fibrillation: Secondary | ICD-10-CM

## 2022-09-14 MED ORDER — PREDNISONE 50 MG PO TABS: ORAL_TABLET | ORAL | 0 refills | Status: DC

## 2022-09-14 NOTE — Telephone Encounter (Signed)
Pt is scheduled for an Afib Ablation with Dr. Elberta Fortis on 11/04/22...  Several years ago she had a reaction to contrast but doesn't remember what type of reaction. I have sent in an Rx for Prednisone to [facility] and pt is aware that Instructions will be on her Instruction letter.   Instruction letters mailed to pt per her request.

## 2022-09-28 ENCOUNTER — Other Ambulatory Visit: Payer: Self-pay

## 2022-09-28 MED ORDER — POTASSIUM CHLORIDE CRYS ER 20 MEQ PO TBCR: 20.0000 meq | EXTENDED_RELEASE_TABLET | Freq: Two times a day (BID) | ORAL | 2 refills | Status: DC

## 2022-09-28 MED ORDER — HYDROCHLOROTHIAZIDE 25 MG PO TABS: 25.0000 mg | ORAL_TABLET | Freq: Every day | ORAL | 2 refills | Status: DC

## 2022-09-29 ENCOUNTER — Telehealth: Payer: Self-pay | Admitting: Cardiology

## 2022-09-29 MED ORDER — METOPROLOL SUCCINATE ER 50 MG PO TB24: 50.0000 mg | ORAL_TABLET | Freq: Every day | ORAL | 3 refills | Status: DC

## 2022-09-29 NOTE — Telephone Encounter (Signed)
STAT if HR is under 50 or over 120 (normal HR is 60-100 beats per minute)  What is your heart rate? 105 this morning   Do you have a log of your heart rate readings (document readings)?   Do you have any other symptoms? Pt states her kardia mobile said "maybe afib". Pt states she is tired. Please advise.

## 2022-09-29 NOTE — Telephone Encounter (Signed)
Called patient back about her message. Patient complaining of elevated HR 105 and 107, patient was on amiodarone, but stopped it due to tremors. Patient stated she has been off the amiodarone for over 100 days. Patient would like to try something to help control her rhythm and HR. Patient has Ablation scheduled on 11/04/22. Will send message to Dr. Elberta Fortis for advisement.   06/24/22 office visit note with Dr. Elberta Fortis " 3.  Atrial fibrillation/atrial flutter: Currently on Eliquis.  CHA2DS2-VASc of 3.  In atrial fibrillation with right bundle branch block today.  She feels quite poorly with fatigue and shortness of breath.  She would benefit from a rhythm control strategy.  She would prefer to avoid antiarrhythmic medications.  Due to that, we will plan for ablation.  Risk and benefits have been discussed.  She understands the risks and is agreed to the procedure."

## 2022-09-29 NOTE — Telephone Encounter (Signed)
Spoke with the patient and advised on recommendations from Dr. Elberta Fortis. Patient verbalized understanding. Prescription has been sent in.

## 2022-10-04 ENCOUNTER — Telehealth: Payer: Self-pay | Admitting: Cardiology

## 2022-10-04 NOTE — Telephone Encounter (Signed)
Patient is calling stating she is wanting to do the sleep apnea test ordered by the Afib clinic prior to the scheduled ablation. She states she wants to rule out that being the issue prior to going through with a procedure. Please advise.

## 2022-10-04 NOTE — Telephone Encounter (Signed)
Pt informed that Dr. Elberta Fortis recommends continuing with ablation -- pt agreeable. Discussed sleep study -- aware I will discuss HSS w/ sleep team tomorrow. Pt aware I will follow up about sleep study.

## 2022-10-21 ENCOUNTER — Ambulatory Visit: Payer: Medicare PPO | Attending: Internal Medicine

## 2022-10-21 DIAGNOSIS — I4819 Other persistent atrial fibrillation: Secondary | ICD-10-CM

## 2022-10-22 LAB — CBC
Hematocrit: 40.8 % (ref 34.0–46.6)
Hemoglobin: 13 g/dL (ref 11.1–15.9)
MCH: 28.4 pg (ref 26.6–33.0)
MCHC: 31.9 g/dL (ref 31.5–35.7)
MCV: 89 fL (ref 79–97)
Platelets: 319 10*3/uL (ref 150–450)
RBC: 4.58 x10E6/uL (ref 3.77–5.28)
RDW: 13.1 % (ref 11.7–15.4)
WBC: 7.8 10*3/uL (ref 3.4–10.8)

## 2022-10-22 LAB — BASIC METABOLIC PANEL
BUN/Creatinine Ratio: 24 (ref 12–28)
BUN: 22 mg/dL (ref 8–27)
CO2: 22 mmol/L (ref 20–29)
Calcium: 8.9 mg/dL (ref 8.7–10.3)
Chloride: 103 mmol/L (ref 96–106)
Creatinine, Ser: 0.93 mg/dL (ref 0.57–1.00)
Glucose: 103 mg/dL — ABNORMAL HIGH (ref 70–99)
Potassium: 4.2 mmol/L (ref 3.5–5.2)
Sodium: 141 mmol/L (ref 134–144)
eGFR: 61 mL/min/{1.73_m2} (ref >59)

## 2022-10-27 ENCOUNTER — Telehealth: Payer: Self-pay | Admitting: Physician Assistant

## 2022-10-27 DIAGNOSIS — I4819 Other persistent atrial fibrillation: Secondary | ICD-10-CM

## 2022-10-27 MED ORDER — PREDNISONE 50 MG PO TABS: ORAL_TABLET | ORAL | 0 refills | Status: AC

## 2022-10-27 NOTE — Telephone Encounter (Signed)
Pt called stating only one dose of prednisone. I have sent prednisone to the 24 hr [facility]. Her son will pick up the prescription.

## 2022-10-28 ENCOUNTER — Ambulatory Visit (HOSPITAL_COMMUNITY)
Admission: RE | Admit: 2022-10-28 | Discharge: 2022-10-28 | Disposition: A | Discharge status: Home / Self Care | Payer: Medicare PPO | Source: Ambulatory Visit | Attending: Cardiology | Admitting: Cardiology

## 2022-10-28 DIAGNOSIS — I4819 Other persistent atrial fibrillation: Secondary | ICD-10-CM | POA: Diagnosis not present

## 2022-10-28 MED ORDER — IOHEXOL 350 MG/ML SOLN
100.0000 mL | Freq: Once | INTRAVENOUS | Status: AC | PRN
  Administered 2022-10-28: 100 mL via INTRAVENOUS

## 2022-11-03 NOTE — Pre-Procedure Instructions (Signed)
Attempted to call patient regarding procedure instructions.  Left voicemail on the following. Instructed patient on the following items: Arrival time 0800 Nothing to eat or drink after midnight No meds AM of procedure Responsible person to drive you home and stay with you for 24 hrs  Have you missed any doses of anti-coagulant Eliquis- should be taken twice a day, if you have missed any doses please let us know.  Don't take dose in the morning.

## 2022-11-03 NOTE — Anesthesia Preprocedure Evaluation (Addendum)
Anesthesia Evaluation  Patient identified by MRN, date of birth, ID band Patient awake    Reviewed: Allergy & Precautions, H&P , NPO status , Patient's Chart, lab work & pertinent test results  Airway Mallampati: II  TM Distance: >3 FB Neck ROM: Full    Dental no notable dental hx. (+) Teeth Intact, Dental Advisory Given   Pulmonary neg pulmonary ROS   Pulmonary exam normal breath sounds clear to auscultation       Cardiovascular Exercise Tolerance: Good hypertension, Pt. on medications and Pt. on home beta blockers + dysrhythmias Atrial Fibrillation  Rhythm:Irregular Rate:Normal     Neuro/Psych   Anxiety Depression    negative neurological ROS     GI/Hepatic Neg liver ROS,GERD  ,,  Endo/Other  negative endocrine ROS    Renal/GU Renal InsufficiencyRenal disease  negative genitourinary   Musculoskeletal  (+) Arthritis , Osteoarthritis,    Abdominal   Peds  Hematology  (+) Blood dyscrasia, anemia   Anesthesia Other Findings   Reproductive/Obstetrics negative OB ROS                             Anesthesia Physical Anesthesia Plan  ASA: 3  Anesthesia Plan: General   Post-op Pain Management: Tylenol PO (pre-op)*   Induction: Intravenous  PONV Risk Score and Plan: 4 or greater and Ondansetron, Dexamethasone and Treatment may vary due to age or medical condition  Airway Management Planned: Oral ETT  Additional Equipment:   Intra-op Plan:   Post-operative Plan: Extubation in OR  Informed Consent: I have reviewed the patients History and Physical, chart, labs and discussed the procedure including the risks, benefits and alternatives for the proposed anesthesia with the patient or authorized representative who has indicated his/her understanding and acceptance.     Dental advisory given  Plan Discussed with: CRNA  Anesthesia Plan Comments:        Anesthesia Quick  Evaluation

## 2022-11-04 ENCOUNTER — Ambulatory Visit (HOSPITAL_COMMUNITY)
Admission: RE | Admit: 2022-11-04 | Discharge: 2022-11-04 | Disposition: A | Discharge status: Home / Self Care | Payer: Medicare PPO | Source: Ambulatory Visit | Attending: Cardiology | Admitting: Cardiology

## 2022-11-04 ENCOUNTER — Encounter (HOSPITAL_COMMUNITY)
Admission: RE | Disposition: A | Discharge status: Home / Self Care | Payer: Self-pay | Source: Ambulatory Visit | Attending: Cardiology

## 2022-11-04 ENCOUNTER — Ambulatory Visit (HOSPITAL_COMMUNITY): Payer: Medicare PPO | Admitting: Anesthesiology

## 2022-11-04 ENCOUNTER — Encounter (HOSPITAL_COMMUNITY): Payer: Self-pay | Admitting: Cardiology

## 2022-11-04 ENCOUNTER — Other Ambulatory Visit: Payer: Self-pay

## 2022-11-04 DIAGNOSIS — I4819 Other persistent atrial fibrillation: Secondary | ICD-10-CM | POA: Diagnosis not present

## 2022-11-04 DIAGNOSIS — N189 Chronic kidney disease, unspecified: Secondary | ICD-10-CM | POA: Diagnosis not present

## 2022-11-04 DIAGNOSIS — I129 Hypertensive chronic kidney disease with stage 1 through stage 4 chronic kidney disease, or unspecified chronic kidney disease: Secondary | ICD-10-CM | POA: Diagnosis not present

## 2022-11-04 DIAGNOSIS — I4892 Unspecified atrial flutter: Secondary | ICD-10-CM | POA: Insufficient documentation

## 2022-11-04 DIAGNOSIS — M1990 Unspecified osteoarthritis, unspecified site: Secondary | ICD-10-CM | POA: Diagnosis not present

## 2022-11-04 DIAGNOSIS — I493 Ventricular premature depolarization: Secondary | ICD-10-CM | POA: Diagnosis not present

## 2022-11-04 DIAGNOSIS — I10 Essential (primary) hypertension: Secondary | ICD-10-CM | POA: Diagnosis not present

## 2022-11-04 DIAGNOSIS — F418 Other specified anxiety disorders: Secondary | ICD-10-CM | POA: Diagnosis not present

## 2022-11-04 DIAGNOSIS — I4510 Unspecified right bundle-branch block: Secondary | ICD-10-CM | POA: Insufficient documentation

## 2022-11-04 HISTORY — PX: ATRIAL FIBRILLATION ABLATION: EP1191

## 2022-11-04 LAB — POCT ACTIVATED CLOTTING TIME: Activated Clotting Time: 330 seconds

## 2022-11-04 SURGERY — ATRIAL FIBRILLATION ABLATION
Anesthesia: General

## 2022-11-04 MED ORDER — HEPARIN (PORCINE) IN NACL 1000-0.9 UT/500ML-% IV SOLN
INTRAVENOUS | Status: DC | PRN
  Administered 2022-11-04 (×4): 500 mL

## 2022-11-04 MED ORDER — LIDOCAINE 2% (20 MG/ML) 5 ML SYRINGE
INTRAMUSCULAR | Status: DC | PRN
  Administered 2022-11-04: 100 mg via INTRAVENOUS

## 2022-11-04 MED ORDER — SODIUM CHLORIDE 0.9% FLUSH: 3.0000 mL | INTRAVENOUS | Status: DC | PRN

## 2022-11-04 MED ORDER — PROPOFOL 10 MG/ML IV BOLUS
INTRAVENOUS | Status: DC | PRN
Start: 2022-11-04 — End: 2022-11-04
  Administered 2022-11-04: 120 mg via INTRAVENOUS

## 2022-11-04 MED ORDER — ACETAMINOPHEN 500 MG PO TABS
1000.0000 mg | ORAL_TABLET | Freq: Once | ORAL | Status: AC
  Administered 2022-11-04: 1000 mg via ORAL
  Filled 2022-11-04: qty 2

## 2022-11-04 MED ORDER — ROCURONIUM BROMIDE 10 MG/ML (PF) SYRINGE
PREFILLED_SYRINGE | INTRAVENOUS | Status: DC | PRN
  Administered 2022-11-04: 10 mg via INTRAVENOUS
  Administered 2022-11-04: 40 mg via INTRAVENOUS

## 2022-11-04 MED ORDER — HEPARIN SODIUM (PORCINE) 1000 UNIT/ML IJ SOLN
INTRAMUSCULAR | Status: DC | PRN
Start: 2022-11-04 — End: 2022-11-04
  Administered 2022-11-04: 14000 [IU] via INTRAVENOUS
  Administered 2022-11-04: 1000 [IU] via INTRAVENOUS

## 2022-11-04 MED ORDER — PROTAMINE SULFATE 10 MG/ML IV SOLN
INTRAVENOUS | Status: DC | PRN
Start: 2022-11-04 — End: 2022-11-04
  Administered 2022-11-04: 40 mg via INTRAVENOUS

## 2022-11-04 MED ORDER — SUGAMMADEX SODIUM 200 MG/2ML IV SOLN
INTRAVENOUS | Status: DC | PRN
  Administered 2022-11-04: 200 mg via INTRAVENOUS

## 2022-11-04 MED ORDER — ATROPINE SULFATE 1 MG/ML IV SOLN
INTRAVENOUS | Status: DC | PRN
Start: 2022-11-04 — End: 2022-11-04
  Administered 2022-11-04: 1 mg via INTRAVENOUS

## 2022-11-04 MED ORDER — PHENYLEPHRINE HCL-NACL 20-0.9 MG/250ML-% IV SOLN
INTRAVENOUS | Status: DC | PRN
  Administered 2022-11-04: 40 ug/min via INTRAVENOUS

## 2022-11-04 MED ORDER — ONDANSETRON HCL 4 MG/2ML IJ SOLN: 4.0000 mg | Freq: Four times a day (QID) | INTRAMUSCULAR | Status: DC | PRN

## 2022-11-04 MED ORDER — SODIUM CHLORIDE 0.9 % IV SOLN: 250.0000 mL | INTRAVENOUS | Status: DC | PRN

## 2022-11-04 MED ORDER — FENTANYL CITRATE (PF) 250 MCG/5ML IJ SOLN
INTRAMUSCULAR | Status: DC | PRN
  Administered 2022-11-04: 100 ug via INTRAVENOUS

## 2022-11-04 MED ORDER — SODIUM CHLORIDE 0.9% FLUSH: 3.0000 mL | Freq: Two times a day (BID) | INTRAVENOUS | Status: DC

## 2022-11-04 MED ORDER — HEPARIN SODIUM (PORCINE) 1000 UNIT/ML IJ SOLN
INTRAMUSCULAR | Status: AC
  Filled 2022-11-04: qty 10

## 2022-11-04 MED ORDER — ONDANSETRON HCL 4 MG/2ML IJ SOLN
INTRAMUSCULAR | Status: DC | PRN
  Administered 2022-11-04: 4 mg via INTRAVENOUS

## 2022-11-04 MED ORDER — SODIUM CHLORIDE 0.9 % IV SOLN: INTRAVENOUS | Status: DC

## 2022-11-04 MED ORDER — DEXAMETHASONE SODIUM PHOSPHATE 10 MG/ML IJ SOLN
INTRAMUSCULAR | Status: DC | PRN
  Administered 2022-11-04: 10 mg via INTRAVENOUS

## 2022-11-04 SURGICAL SUPPLY — 23 items
BAG SNAP BAND KOVER 36X36 (MISCELLANEOUS) IMPLANT
CABLE PFA RX CATH CONN (CABLE) IMPLANT
CATH EZ STEER NAV 8MM F-J CUR (ABLATOR) IMPLANT
CATH FARAWAVE ABLATION 31 (CATHETERS) IMPLANT
CATH OCTARAY 2.0 F 3-3-3-3-3 (CATHETERS) IMPLANT
CATH SOUNDSTAR ECO 8FR (CATHETERS) IMPLANT
CATH WEBSTER BI DIR CS D-F CRV (CATHETERS) IMPLANT
CLOSURE MYNX CONTROL 6F/7F (Vascular Products) IMPLANT
CLOSURE PERCLOSE PROSTYLE (VASCULAR PRODUCTS) IMPLANT
COVER SWIFTLINK CONNECTOR (BAG) ×2 IMPLANT
DILATOR VESSEL 38 20CM 16FR (INTRODUCER) IMPLANT
GUIDEWIRE INQWIRE 1.5J.035X260 (WIRE) IMPLANT
INQWIRE 1.5J .035X260CM (WIRE) ×1
PACK EP LATEX FREE (CUSTOM PROCEDURE TRAY) ×1
PACK EP LF (CUSTOM PROCEDURE TRAY) ×2 IMPLANT
PAD DEFIB RADIO PHYSIO CONN (PAD) ×2 IMPLANT
PATCH CARTO3 (PAD) IMPLANT
SHEATH FARADRIVE STEERABLE (SHEATH) IMPLANT
SHEATH PINNACLE 7F 10CM (SHEATH) IMPLANT
SHEATH PINNACLE 8F 10CM (SHEATH) IMPLANT
SHEATH PINNACLE 9F 10CM (SHEATH) IMPLANT
SHEATH PROBE COVER 6X72 (BAG) IMPLANT
SHEATH WIRE KIT BAYLIS SL1 (KITS) IMPLANT

## 2022-11-04 NOTE — H&P (Signed)
Electrophysiology Office Note   Date:  11/04/2022   ID:  Koi, Brandli 1940-05-27, MRN [id number]  PCP:  Collene Mares, PA  Cardiologist:  Mayford Knife Primary Electrophysiologist:  Regan Lemming, MD    No chief complaint on file.    History of Present Illness: [name] is a 82 y.o. female who is being seen today for the evaluation of PVCs at the request of Armanda Magic. Presenting today for electrophysiology evaluation.    She has a history significant for hypertension and right bundle branch block.  She was noted to have PVCs and wore cardiac monitor that showed a 15% burden.  She was started on flecainide with reduction in her burden to less than 1%.  She unfortunately began having episodes of atrial fibrillation.  Flecainide was stopped and she was started on amiodarone.  She felt quite poorly on amiodarone with significant tremors.  She also had a fall requiring 2 ankle surgeries and a prolonged recovery.  She is walking with a walker due to ankle and knee pain.  Today, denies symptoms of palpitations, chest pain, shortness of breath, orthopnea, PND, lower extremity edema, claudication, dizziness, presyncope, syncope, bleeding, or neurologic sequela. The patient is tolerating medications without difficulties. Plan ablation today.    Past Medical History:  Diagnosis Date   Allergy    Anemia    hx of years ago    Anxiety    Arthritis    Blood transfusion without reported diagnosis    after hip replacement 2013   Cataract    removed both eyes   Chronic kidney disease    hx of uti    Colon polyp    Depression    DJD (degenerative joint disease)    GERD (gastroesophageal reflux disease)    Hypertension    Lumbar radiculopathy    S/P ESI- Dr Jillyn Hidden Dr. Ethelene Hal   Neuromuscular disorder (HCC)    numbness in middle toes on left leg    Osteoporosis    Pneumonia    hx of    RBBB    Seasonal allergies    Syncope and collapse    in the setting of diarrheal  illness-Dr turner   Past Surgical History:  Procedure Laterality Date   BACK SURGERY  2006   CARDIAC CATHETERIZATION  2003   Normal coronary arteries   COLONOSCOPY     POLYPECTOMY     RECTOCELE REPAIR  1993   TOTAL HIP ARTHROPLASTY  2008   right   TOTAL HIP ARTHROPLASTY  12/16/2011   Procedure: TOTAL HIP ARTHROPLASTY;  Surgeon: Javier Docker, MD;  Location: [facility];  Service: Orthopedics;  Laterality: Left;   VAGINAL HYSTERECTOMY  1981   WISDOM TOOTH EXTRACTION       Current Facility-Administered Medications  Medication Dose Route Frequency Provider Last Rate Last Admin   0.9 %  sodium chloride infusion   Intravenous Continuous Regan Lemming, MD 50 mL/hr at 11/04/22 0829 New Bag at 11/04/22 0829    Allergies:   Bioflavonoids, Cephalexin, Ciprocin-fluocin-procin [fluocinolone], Citric acid, Citrus bioflavonoid, Cleocin [clindamycin hcl], Cranberry, Iodine, Lemon flavor, Other, Percocet [oxycodone-acetaminophen], and Septra [sulfamethoxazole-trimethoprim]   Social History:  The patient  reports that she has never smoked. She has never used smokeless tobacco. She reports that she does not drink alcohol and does not use drugs.   Family History:  The patient's family history includes Colon cancer (age of onset: 63) in her mother; Congestive Heart Failure in her  father; Esophageal cancer in her paternal uncle; Lung cancer in her mother; Pancreatic cancer in her father; Skin cancer in her sister.   ROS:  Please see the history of present illness.   Otherwise, review of systems is positive for none.   All other systems are reviewed and negative.   PHYSICAL EXAM: VS:  BP (!) 167/94   Pulse 85   Temp 98.2 F (36.8 C) (Temporal)   Resp 18   Ht 5\' 5"  (1.651 m)   Wt 79.4 kg   SpO2 95%   BMI 29.12 kg/m  , BMI Body mass index is 29.12 kg/m. GEN: Well nourished, well developed, in no acute distress  HEENT: normal  Neck: no JVD, carotid bruits, or masses Cardiac: RRR; no murmurs,  rubs, or gallops,no edema  Respiratory:  clear to auscultation bilaterally, normal work of breathing GI: soft, nontender, nondistended, + BS MS: no deformity or atrophy  Skin: warm and dry Neuro:  Strength and sensation are intact Psych: euthymic mood, full affect  Recent Labs: 10/21/2022: BUN 22; Creatinine, Ser 0.93; Hemoglobin 13.0; Platelets 319; Potassium 4.2; Sodium 141    Lipid Panel  No results found for: "CHOL", "TRIG", "HDL", "CHOLHDL", "VLDL", "LDLCALC", "LDLDIRECT"   Wt Readings from Last 3 Encounters:  11/04/22 79.4 kg  06/24/22 80.7 kg  10/06/21 83.9 kg      Other studies Reviewed: Additional studies/ records that were reviewed today include: TTE 01/25/18  Review of the above records today demonstrates:  - Left ventricle: The cavity size was normal. Wall thickness was   normal. Systolic function was normal. The estimated ejection   fraction was in the range of 50% to 55%. Wall motion was normal;   there were no regional wall motion abnormalities. - Mitral valve: There was mild regurgitation.  SPECT 01/25/18 Nuclear stress EF: 57%. The left ventricular ejection fraction is normal (55-65%). There was no ST segment deviation noted during stress. The study is normal. This is a low risk study.   Cardiac monitor 08/03/2018 personally reviewed Max 114 bpm 03:23pm, 06/05 Min 45 bpm 08:59am, 06/05 Avg 54 bpm <1% PVCs and PACs Sinus rhythm with episodes of sinus tachycardia and SVT 23 supraventricular runs longest 8 beats at 114 bpm  ASSESSMENT AND PLAN:  1.  PVCs: 15 % noted on cardiac monitor.  PVCs appear from the LV septum.  Amiodarone has been held as she was having tremors.  No PVCs on ECG today.  2.  Hypertension: Currently well-controlled  3.  Atrial fibrillation/atrial flutter: [name] has presented today for surgery, with the diagnosis of atrial fibrillation.  The various methods of treatment have been discussed with the patient and family.  After consideration of risks, benefits and other options for treatment, the patient has consented to  Procedure(s): Catheter ablation as a surgical intervention .  Risks include but not limited to complete heart block, stroke, esophageal damage, nerve damage, bleeding, vascular damage, tamponade, perforation, MI, and death. The patient's history has been reviewed, patient examined, no change in status, stable for surgery.  I have reviewed the patient's chart and labs.  Questions were answered to the patient's satisfaction.    Oda Placke Elberta Fortis, MD 11/04/2022 9:56 AM

## 2022-11-04 NOTE — Progress Notes (Signed)
Patient and son was given discharge instructions. Both verbalized understanding.

## 2022-11-04 NOTE — Anesthesia Procedure Notes (Signed)
Procedure Name: Intubation Date/Time: 11/04/2022 10:42 AM  Performed by: Gaynelle Adu, MDPre-anesthesia Checklist: Patient identified, Emergency Drugs available, Suction available and Patient being monitored Patient Re-evaluated:Patient Re-evaluated prior to induction Oxygen Delivery Method: Circle system utilized Preoxygenation: Pre-oxygenation with 100% oxygen Induction Type: IV induction Ventilation: Mask ventilation without difficulty Laryngoscope Size: Miller and 2 Grade View: Grade I Tube type: Oral Tube size: 7.0 mm Number of attempts: 1 Airway Equipment and Method: Stylet and Oral airway Placement Confirmation: ETT inserted through vocal cords under direct vision, positive ETCO2 and breath sounds checked- equal and bilateral Secured at: 21 cm Tube secured with: Tape Dental Injury: Teeth and Oropharynx as per pre-operative assessment

## 2022-11-04 NOTE — Discharge Instructions (Signed)

## 2022-11-04 NOTE — Transfer of Care (Signed)
Immediate Anesthesia Transfer of Care Note  Patient: [name]  Procedure(s) Performed: ATRIAL FIBRILLATION ABLATION  Patient Location: PACU and Cath Lab  Anesthesia Type:General  Level of Consciousness: awake  Airway & Oxygen Therapy: Patient Spontanous Breathing and Patient connected to face mask oxygen  Post-op Assessment: Report given to RN and Post -op Vital signs reviewed and stable  Post vital signs: Reviewed and stable  Last Vitals:  Vitals Value Taken Time  BP    Temp    Pulse    Resp    SpO2      Last Pain:  Vitals:   11/04/22 0815  TempSrc:   PainSc: 0-No pain         Complications: There were no known notable events for this encounter.

## 2022-11-04 NOTE — Anesthesia Postprocedure Evaluation (Signed)
Anesthesia Post Note  Patient: [name]  Procedure(s) Performed: ATRIAL FIBRILLATION ABLATION     Patient location during evaluation: PACU Anesthesia Type: General Level of consciousness: awake and alert Pain management: pain level controlled Vital Signs Assessment: post-procedure vital signs reviewed and stable Respiratory status: spontaneous breathing, nonlabored ventilation and respiratory function stable Cardiovascular status: blood pressure returned to baseline and stable Postop Assessment: no apparent nausea or vomiting Anesthetic complications: no  There were no known notable events for this encounter.  Last Vitals:  Vitals:   11/04/22 1400 11/04/22 1430  BP: 129/63 134/60  Pulse: 68 74  Resp: 15 16  Temp:    SpO2: 92% 94%    Last Pain:  Vitals:   11/04/22 1316  TempSrc:   PainSc: 0-No pain                 Marizol Borror,W. EDMOND

## 2022-11-08 ENCOUNTER — Telehealth: Payer: Self-pay | Admitting: Cardiology

## 2022-11-08 NOTE — Telephone Encounter (Signed)
Pt advised to take off her groin bandages. Patient verbalized understanding and agreeable to plan.

## 2022-11-08 NOTE — Telephone Encounter (Signed)
Patient wants a call back to discuss questions she has regarding ablation puncture site.

## 2022-11-17 ENCOUNTER — Telehealth: Payer: Self-pay | Admitting: Cardiology

## 2022-11-17 DIAGNOSIS — D492 Neoplasm of unspecified behavior of bone, soft tissue, and skin: Secondary | ICD-10-CM | POA: Diagnosis not present

## 2022-11-17 DIAGNOSIS — L281 Prurigo nodularis: Secondary | ICD-10-CM | POA: Diagnosis not present

## 2022-11-17 DIAGNOSIS — D485 Neoplasm of uncertain behavior of skin: Secondary | ICD-10-CM | POA: Diagnosis not present

## 2022-11-17 DIAGNOSIS — R202 Paresthesia of skin: Secondary | ICD-10-CM | POA: Diagnosis not present

## 2022-11-17 DIAGNOSIS — L905 Scar conditions and fibrosis of skin: Secondary | ICD-10-CM | POA: Diagnosis not present

## 2022-11-17 NOTE — Telephone Encounter (Signed)
Pt c/o BP issue: STAT if pt c/o blurred vision, one-sided weakness or slurred speech  1. What are your last 5 BP readings?  Patient is driving, will check BP when home.  2. Are you having any other symptoms (ex. Dizziness, headache, blurred vision, passed out)?  Blurred Vision 3. What is your BP issue? Patient has blurred vision today and thinks it could be from low blood pressure. Patient states she is not home, but once she is home she will check her BP. Please advise.

## 2022-11-17 NOTE — Telephone Encounter (Signed)
Pt calling in to report that she experienced double vision this afternoon while a passenger in a car, she was unable to check her BP and did not think to check [employer] mobile. Pt current BP 123/47, HR 56. Advised to continue monitoring for now.  Explained that without those numbers it is hard to determine what may have been the cause. Advised to monitor BP & HR twice daily and if becomes symptomatic-- asked her to do this over the next several days. Advised to call office back if reoccurs and/or go to ED if needed. Patient verbalized understanding and agreeable to plan.

## 2022-12-01 ENCOUNTER — Ambulatory Visit (HOSPITAL_COMMUNITY): Payer: Medicare PPO | Admitting: Internal Medicine

## 2022-12-15 ENCOUNTER — Other Ambulatory Visit: Payer: Self-pay | Admitting: *Deleted

## 2022-12-15 DIAGNOSIS — I480 Paroxysmal atrial fibrillation: Secondary | ICD-10-CM

## 2022-12-15 DIAGNOSIS — R400 Somnolence: Secondary | ICD-10-CM

## 2022-12-15 DIAGNOSIS — R0683 Snoring: Secondary | ICD-10-CM

## 2022-12-15 DIAGNOSIS — I4819 Other persistent atrial fibrillation: Secondary | ICD-10-CM

## 2022-12-15 NOTE — Telephone Encounter (Signed)
Aware  I am placing order for HSS today. Patient verbalized understanding and agreeable to plan.

## 2022-12-22 ENCOUNTER — Encounter (HOSPITAL_COMMUNITY): Payer: Self-pay | Admitting: Internal Medicine

## 2022-12-22 ENCOUNTER — Ambulatory Visit (HOSPITAL_COMMUNITY)
Admission: RE | Admit: 2022-12-22 | Discharge: 2022-12-22 | Disposition: A | Discharge status: Home / Self Care | Payer: Medicare PPO | Source: Ambulatory Visit | Attending: Internal Medicine | Admitting: Internal Medicine

## 2022-12-22 VITALS — BP 138/74 | HR 66 | Ht 65.0 in | Wt 181.6 lb

## 2022-12-22 DIAGNOSIS — I10 Essential (primary) hypertension: Secondary | ICD-10-CM | POA: Diagnosis not present

## 2022-12-22 DIAGNOSIS — D6869 Other thrombophilia: Secondary | ICD-10-CM | POA: Insufficient documentation

## 2022-12-22 DIAGNOSIS — I4891 Unspecified atrial fibrillation: Secondary | ICD-10-CM | POA: Diagnosis present

## 2022-12-22 DIAGNOSIS — I493 Ventricular premature depolarization: Secondary | ICD-10-CM | POA: Insufficient documentation

## 2022-12-22 DIAGNOSIS — Z7901 Long term (current) use of anticoagulants: Secondary | ICD-10-CM | POA: Insufficient documentation

## 2022-12-22 DIAGNOSIS — I4819 Other persistent atrial fibrillation: Secondary | ICD-10-CM | POA: Diagnosis not present

## 2022-12-22 NOTE — Progress Notes (Signed)
Primary Care Physician: Collene Mares, [place] Primary Cardiologist: Dr Mayford Knife Primary Electrophysiologist: Dr Elberta Fortis  Referring Physician: Dr Stephannie Peters [name] is a 82 y.o. female with a history of PVCs, HTN, atrial fibrillation who presents for follow up in the [facility].  The patient was initially diagnosed with atrial fibrillation 06/17/21 after presenting to Dr Elberta Fortis office with symptoms of weakness, fatigue, and SOB. ECG showed rate controlled afib. Patient was started on Eliquis for a CHADS2VASC score of 4.  Patient had  cardioversion scheduled for 6/15. She missed a dose of eliquis so TEE/CV was ordered, but then ekg showed that she was in sinus rhythm  so procedure was cancelled. She is now here for f/u.   [name] Her ekg shows sinus brady with RBBB.   She reports that [place] with a left ankle fx that required 2 surgeries and was in rehab for awhile  in the area.  She now is home and is here today with her son. She anticipated wearing the cast another couple of weeks. She continues with eliquis 5 mg bid, states she was on Lovenox for a while in the hospital.  She is in SR. She has been taking amio 200 mg bid since June and that will  be reduced to 200 mg daily today.   On follow up 12/22/22, she is currently in NSR. S/p Afib ablation on 11/04/22. No episodes of Afib since ablation. She still feels a little tired and some SOB with lots of exertion, but overall she does note to feeling better than before. No chest pain. Leg sites healed without issue. No missed doses of anticoagulant.  Today, she denies symptoms of orthopnea, PND, lower extremity edema, dizziness, presyncope, syncope, snoring, daytime somnolence, bleeding, or neurologic sequela. The patient is tolerating medications without difficulties and is otherwise without complaint today.    Atrial Fibrillation Risk Factors:  she does not have symptoms or  diagnosis of sleep apnea. she does not have a history of rheumatic fever.   she has a BMI of Body mass index is 30.22 kg/m.[name] Filed Weights   12/22/22 1537  Weight: 82.4 kg     Family History  Problem Relation Age of Onset   Colon cancer Mother 67   Lung cancer Mother    Pancreatic cancer Father    Congestive Heart Failure Father    Skin cancer Sister    Esophageal cancer Paternal Uncle    Stomach cancer Neg Hx    Colon polyps Neg Hx    Rectal cancer Neg Hx     Atrial Fibrillation Management history:  Previous antiarrhythmic drugs: flecainide (stopped due to failure), amiodarone (stopped due to side effects) Previous cardioversions: none Previous ablations: 11/04/22 CHADS2VASC score: 4 Anticoagulation history: Eliquis   Past Medical History:  Diagnosis Date   Allergy    Anemia    hx of years ago    Anxiety    Arthritis    Blood transfusion without reported diagnosis    after hip replacement 2013   Cataract    removed both eyes   Chronic kidney disease    hx of uti    Colon polyp    Depression    DJD (degenerative joint disease)    GERD (gastroesophageal reflux disease)    Hypertension    Lumbar radiculopathy    S/P ESI- Dr Jillyn Hidden Dr. Ethelene Hal   Neuromuscular disorder [facility])  numbness in middle toes on left leg    Osteoporosis    Pneumonia    hx of    RBBB    Seasonal allergies    Syncope and collapse    in the setting of diarrheal illness-Dr turner   Past Surgical History:  Procedure Laterality Date   ATRIAL FIBRILLATION ABLATION N/A 11/04/2022   Procedure: ATRIAL FIBRILLATION ABLATION;  Surgeon: Regan Lemming, MD;  Location: [facility];  Service: Cardiovascular;  Laterality: N/A;   BACK SURGERY  2006   CARDIAC CATHETERIZATION  2003   Normal coronary arteries   COLONOSCOPY     POLYPECTOMY     RECTOCELE REPAIR  1993   TOTAL HIP ARTHROPLASTY  2008   right   TOTAL HIP ARTHROPLASTY  12/16/2011   Procedure: TOTAL HIP ARTHROPLASTY;   Surgeon: Javier Docker, MD;  Location: [facility];  Service: Orthopedics;  Laterality: Left;   VAGINAL HYSTERECTOMY  1981   WISDOM TOOTH EXTRACTION      Current Outpatient Medications  Medication Sig Dispense Refill   acetaminophen (TYLENOL) 500 MG tablet Take 1,000 mg by mouth every 8 (eight) hours as needed for moderate pain or mild pain.     Ascorbic Acid (VITAMIN C) 1000 MG tablet Take 1,000 mg by mouth daily.     buPROPion (WELLBUTRIN XL) 150 MG 24 hr tablet Take 150 mg by mouth every morning.     Calcium Carbonate-Vitamin D (CALCIUM-D PO) Take 1 tablet by mouth 2 (two) times daily. 1000 mg calcium     cholecalciferol (VITAMIN D3) 25 MCG (1000 UNIT) tablet Take 1,000 Units by mouth daily.     clotrimazole (LOTRIMIN) 1 % cream Apply 1 application  topically 2 (two) times daily as needed (skin fold (yeast prevention)).     diphenhydrAMINE (BENADRYL) 25 MG tablet Take 25 mg by mouth once.     ELIQUIS 5 MG TABS tablet TAKE 1 TABLET TWICE DAILY 180 tablet 3   fish oil-omega-3 fatty acids 1000 MG capsule Take 1 g by mouth at bedtime. Super Omega 3     Ginger, Zingiber officinalis, (GINGER PO) Take 1,100 mg by mouth daily.     Glucosamine HCl (GLUCOSAMINE PO) Take 1 capsule by mouth every morning.     hydrochlorothiazide (HYDRODIURIL) 25 MG tablet Take 1 tablet (25 mg total) by mouth daily. 90 tablet 2   ketotifen (ZADITOR) 0.035 % ophthalmic solution Place 1 drop into both eyes daily as needed (allergies).     Lactobacillus-Inulin (CULTURELLE DIGESTIVE DAILY PO) Take 200 mg by mouth daily.     loratadine (CLARITIN) 10 MG tablet Take 10 mg by mouth every evening.     LORazepam (ATIVAN) 1 MG tablet Take 0.5 mg by mouth at bedtime. scheduled     losartan (COZAAR) 100 MG tablet Take 1 tablet (100 mg total) by mouth daily. (Patient taking differently: Take 100 mg by mouth at bedtime.) 90 tablet 2   metoprolol succinate (TOPROL-XL) 50 MG 24 hr tablet Take 1 tablet (50 mg total) by mouth daily. Take  with or immediately following a meal. (Patient taking differently: Take 50 mg by mouth at bedtime. Take with or immediately following a meal.) 90 tablet 3   miconazole (MICOTIN) 2 % powder Apply 1 Application topically daily as needed for itching (yeast).     nitrofurantoin, macrocrystal-monohydrate, (MACROBID) 100 MG capsule Take 100 mg by mouth at bedtime.     OVER THE COUNTER MEDICATION Take 1 tablet by mouth every morning.  B-right by Patsey Berthold     PARoxetine (PAXIL) 40 MG tablet Take 40 mg by mouth daily before breakfast.      potassium chloride SA (KLOR-CON M) 20 MEQ tablet Take 1 tablet (20 mEq total) by mouth 2 (two) times daily. 180 tablet 2   predniSONE (DELTASONE) 50 MG tablet Take 50 mg by mouth 13 hours prior to CT Scan, 7 hours prior and 1 hour prior. 3 tablet 0   traMADol (ULTRAM) 50 MG tablet Take 50 mg by mouth every 8 (eight) hours as needed.     Zinc 30 MG CAPS Take 30 mg by mouth daily.     Current Facility-Administered Medications  Medication Dose Route Frequency Provider Last Rate Last Admin   0.9 %  sodium chloride infusion  500 mL Intravenous Once [name], Eleonore Chiquito, MD        Allergies  Allergen Reactions   Bioflavonoids Other (See Comments)    Citrus Bioflavonoids   Cephalexin Diarrhea   Ciprocin-Fluocin-Procin [Fluocinolone] Diarrhea   Citric Acid     Headache, swelling around eyes and mouth   Citrus Bioflavonoid Other (See Comments)   Cleocin [Clindamycin Hcl] Diarrhea   Cranberry Hives   Iodine Other (See Comments)    Unknown reaction   Lemon Flavor Other (See Comments)    Swelling of the eyes, mouth and lips, Breaks her out in splotches   Other     The Lidocaine Glycerin HCL   Percocet [Oxycodone-Acetaminophen] Other (See Comments)    Caused Hallucinations   Septra [Sulfamethoxazole-Trimethoprim] Diarrhea   ROS- All systems are reviewed and negative except as per the HPI above.  Physical Exam: Vitals:   12/22/22 1537  BP: 138/74  Pulse: 66  SpO2:  96%  Weight: 82.4 kg  Height: 5\' 5"  (1.651 m)    GEN- The patient is well appearing, alert and oriented x 3 today.   Neck - no JVD or carotid bruit noted Lungs- Clear to ausculation bilaterally, normal work of breathing Heart- Regular rate and rhythm, no murmurs, rubs or gallops, PMI not laterally displaced Extremities- no clubbing, cyanosis, or edema Skin - no rash or ecchymosis noted  Wt Readings from Last 3 Encounters:  12/22/22 82.4 kg  11/04/22 79.4 kg  06/24/22 80.7 kg    EKG today demonstrates  Vent. rate 66 BPM PR interval 120 ms QRS duration 146 ms QT/QTcB 456/478 ms P-R-T axes 69 76 -34 Normal sinus rhythm Right bundle branch block T wave abnormality, consider inferior ischemia Abnormal ECG When compared with ECG of 04-Nov-2022 12:49, PREVIOUS ECG IS PRESENT  Echo 01/25/18 demonstrated  Left ventricle: The cavity size was normal. Wall thickness was    normal. Systolic function was normal. The estimated ejection    fraction was in the range of 50% to 55%. Wall motion was normal;    there were no regional wall motion abnormalities.  - Mitral valve: There was mild regurgitation.  Epic records are reviewed at length today  CHA2DS2-VASc Score =   4 The patient's score is based upon:  Age, gender, HTN       ASSESSMENT AND PLAN: 1. Persistent Atrial Fibrillation (ICD10:  I48.19) The patient's CHA2DS2-VASc score is  4, indicating a  4.8% annual risk of stroke.   S/p Afib ablation on 11/04/22 by Dr. Elberta Fortis.  She is currently in NSR.  Continue Eliquis 5 mg BID Continue Toprol 50 mg daily  2. Secondary Hypercoagulable State (ICD10:  D68.69) The patient is at significant risk for stroke/thromboembolism  based upon her CHA2DS2-VASc Score of  4.  Continue Apixaban (Eliquis).   3. HTN Stable, no changes today.  4. PVCs Continue beta blocker.    Follow up as scheduled with Dr. Elberta Fortis.    Justin Mend, PA-C Af[facility] [address] [place], [place] [address] [phone]

## 2022-12-23 DIAGNOSIS — N390 Urinary tract infection, site not specified: Secondary | ICD-10-CM | POA: Diagnosis not present

## 2022-12-23 DIAGNOSIS — N898 Other specified noninflammatory disorders of vagina: Secondary | ICD-10-CM | POA: Diagnosis not present

## 2022-12-23 DIAGNOSIS — D6869 Other thrombophilia: Secondary | ICD-10-CM | POA: Diagnosis not present

## 2022-12-23 DIAGNOSIS — F325 Major depressive disorder, single episode, in full remission: Secondary | ICD-10-CM | POA: Diagnosis not present

## 2022-12-23 DIAGNOSIS — N952 Postmenopausal atrophic vaginitis: Secondary | ICD-10-CM | POA: Diagnosis not present

## 2023-02-06 ENCOUNTER — Telehealth: Payer: Self-pay

## 2023-02-06 NOTE — Telephone Encounter (Signed)
Ordering provider: [place] Associated diagnoses: Snoring, Daytime Somnolence, Persistent atrial fibrillation  WatchPAT PA obtained on 02/06/2023 by Ashley Mariner, LPN. Authorization: Yes; tracking ID  [id number] Patient notified of PIN (1234) on 02/06/2023 via Notification Method: phone.  Phone note routed to covering staff for follow-up.  Instructions for covering staff:  Please contact patient in 2 weeks if WatchPAT study results are not available yet. Remind patient to complete test.  If patient declines to proceed with test, please confirm that box is unopened and remind patient to return it to the office within 30 days. Route phone note to CV DIV SLEEP STUDIES pool for tracking.  If box has been opened, please route phone note to CV DIV SLEEP STUDIES pool to have device de-initialized and processed for billing.

## 2023-02-09 ENCOUNTER — Other Ambulatory Visit: Payer: Self-pay | Admitting: Cardiology

## 2023-02-09 DIAGNOSIS — I4819 Other persistent atrial fibrillation: Secondary | ICD-10-CM

## 2023-02-09 NOTE — Telephone Encounter (Signed)
[facility] called in requesting refill to [facility].

## 2023-02-09 NOTE — Telephone Encounter (Signed)
Prescription refill request for Eliquis received. Indication: a fib Last office visit: 12/22/22 Scr: 0.93 10/21/22 epic Age: 82 Weight: 80kg

## 2023-02-21 NOTE — Progress Notes (Deleted)
  Cardiology Office Note:  .   Date:  02/21/2023  ID:  [name], DOB February 05, 1941, MRN [id number] PCP: Cle[place]ilde, [place]  E, PA  [facility] Providers Cardiologist:  None Electrophysiologist:  Will Sydney Norton, MD {  History of Present Illness: .   [name] is a 83 y.o. female w/PMHx of HTN, PVCs, AFib, RBBB  At her AFib clinic f/u visit remained with some fatigue, SOB but better overall.  Maintaining SR, no changes made   Today's visit is scheduled as her 3 mo post afib ablation visit  ROS:   *** sites *** symptoms, burden *** eliquis , dose, labs, bleeding *** PVCs?   Arrhythmia/AAD hx AFib noted May 2023 Flecainide  started Jan 2020 for PVCs >> devleoped AFib and stopped May 2023 Amiodarone  June 2023 >> poorly tolerated (unclear specifics) stopped April 2024 AFib (PFA) and CTI (RF) ablation 11/04/22  Studies Reviewed: SABRA    EKG done today and reviewed by myself:  ***  11/04/22: EPS/ablation CONCLUSIONS: 1. Sinus rhythm upon presentation.   2. Successful electrical isolation and anatomical encircling of all four pulmonary veins with pulsed field ablation. 3. Posterior wall ablation 4. Cavotricuspid isthmus ablation 5. No early apparent complications.  June 2020: Monitor Max 114 bpm 03:23pm, 06/05 Min 45 bpm 08:59am, 06/05 Avg 54 bpm <1% PVCs and PACs Sinus rhythm with episodes of sinus tachycardia and SVT 23 supraventricular runs longest 8 beats at 114 bpm  01/25/2018: TTE Study Conclusions  - Left ventricle: The cavity size was normal. Wall thickness was    normal. Systolic function was normal. The estimated ejection    fraction was in the range of 50% to 55%. Wall motion was normal;    there were no regional wall motion abnormalities.  - Mitral valve: There was mild regurgitation.    Risk Assessment/Calculations:    Physical Exam:   VS:  There were no vitals taken for this visit.   Wt Readings from Last 3 Encounters:  12/22/22 181  lb 9.6 oz (82.4 kg)  11/04/22 175 lb (79.4 kg)  06/24/22 178 lb (80.7 kg)    GEN: Well nourished, well developed in no acute distress NECK: No JVD; No carotid bruits CARDIAC: ***RRR, no murmurs, rubs, gallops RESPIRATORY:  *** CTA b/l without rales, wheezing or rhonchi  ABDOMEN: Soft, non-tender, non-distended EXTREMITIES:  No edema; No deformity   PPM/ICD/ILR site: *** is stable, no thinning, fluctuation, tethering  ASSESSMENT AND PLAN: .    persistent AFib CHA2DS2Vasc is 4, on eliquis , *** appropriately dosed *** burden by symptoms  PVCs ***  HTN ***  Secondary hypercoagulable state 2/2 AFib     {Are you ordering a CV Procedure (e.g. stress test, cath, DCCV, TEE, etc)?   Press F2        :[id number]}     Dispo: ***  Signed, Charlies Macario Arthur, PA-C

## 2023-02-23 ENCOUNTER — Ambulatory Visit: Payer: Medicare PPO | Admitting: Physician Assistant

## 2023-03-01 NOTE — Telephone Encounter (Signed)
 error

## 2023-03-02 ENCOUNTER — Ambulatory Visit: Payer: Medicare PPO | Admitting: Pulmonary Disease

## 2023-03-02 DIAGNOSIS — L821 Other seborrheic keratosis: Secondary | ICD-10-CM | POA: Diagnosis not present

## 2023-03-02 DIAGNOSIS — D225 Melanocytic nevi of trunk: Secondary | ICD-10-CM | POA: Diagnosis not present

## 2023-03-02 DIAGNOSIS — L814 Other melanin hyperpigmentation: Secondary | ICD-10-CM | POA: Diagnosis not present

## 2023-03-02 DIAGNOSIS — L281 Prurigo nodularis: Secondary | ICD-10-CM | POA: Diagnosis not present

## 2023-03-16 ENCOUNTER — Ambulatory Visit: Payer: Medicare PPO | Admitting: Physician Assistant

## 2023-03-26 NOTE — Progress Notes (Signed)
  Electrophysiology Office Note:   Date:  03/27/2023  ID:  [name], DOB Jun 05, 1940, MRN [id number]  Primary Cardiologist: None Primary Heart Failure: None Electrophysiologist: Will Cortland Ding, MD      History of Present Illness:   [name] is a 83 y.o. female with h/o RBBB, AF, PVC's, HTN seen today for routine electrophysiology followup 3 months post ablation.   Since last being seen in our clinic the patient reports doing ok. She notes she has gained 8 lbs since her ablation. She has "struggled with depression all her life". No symptoms of AF since her ablation.  She denies chest pain, palpitations, dyspnea, PND, orthopnea, nausea, vomiting, dizziness, syncope, edema, weight gain, or early satiety.   Review of systems complete and found to be negative unless listed in HPI.   EP Information / Studies Reviewed:    EKG is not ordered today. EKG from 12/22/22 reviewed which showed NSR with RBBB 66 bpm      Studies:  Cardiac Monitor 07/2016 > SR with episodes of ST, SVT.  <1% PVC's/PAC's    Arrhythmia / AAD AF > initial dx 06/2021 AFL  PVI / CTI ablation 11/04/2022     Risk Assessment/Calculations:    CHA2DS2-VASc Score = 4   This indicates a 4.8% annual risk of stroke. The patient's score is based upon: CHF History: 0 HTN History: 1 Diabetes History: 0 Stroke History: 0 Vascular Disease History: 0 Age Score: 2 Gender Score: 1        STOP-Bang Score:  4       Physical Exam:   VS:  BP 128/82   Pulse (!) 56   Ht 5\' 5"  (1.651 m)   Wt 183 lb 3.2 oz (83.1 kg)   SpO2 96%   BMI 30.49 kg/m    Wt Readings from Last 3 Encounters:  03/27/23 183 lb 3.2 oz (83.1 kg)  12/22/22 181 lb 9.6 oz (82.4 kg)  11/04/22 175 lb (79.4 kg)     GEN: Well nourished, well developed in no acute distress NECK: No JVD; No carotid bruits CARDIAC: Regular rate and rhythm, no murmurs, rubs, gallops RESPIRATORY:  Clear to auscultation without rales, wheezing or rhonchi   ABDOMEN: Soft, non-tender, non-distended EXTREMITIES:  No edema; No deformity   ASSESSMENT AND PLAN:    Persistent Atrial Fibrillation  CHA2DS2-VASc 4. S/p ablation of PVI, CTI 10/2022 -no symptom burden since ablation   -monitors with [facility]   -EKG with NSR in November post ablation   -continue Toprol  50 mg daily  -OAC for stroke prophylaxis   Secondary Hypercoagulable State  -continue Eliquis  5mg  BID, dose reviewed and appropriate by wt / Cr  PVC's  -continue beta blocker   Hypertension  -well controlled on current regimen    Follow up with Dr. Lawana Pray in 6 months  Signed, Creighton Doffing, NP-C, AGACNP-BC [facility] - Electrophysiology  03/27/2023, 6:15 PM

## 2023-03-27 ENCOUNTER — Telehealth: Payer: Self-pay | Admitting: *Deleted

## 2023-03-27 ENCOUNTER — Encounter: Payer: Self-pay | Admitting: Pulmonary Disease

## 2023-03-27 ENCOUNTER — Ambulatory Visit: Payer: Medicare PPO | Attending: Pulmonary Disease | Admitting: Pulmonary Disease

## 2023-03-27 VITALS — BP 128/82 | HR 56 | Ht 65.0 in | Wt 183.2 lb

## 2023-03-27 DIAGNOSIS — I4819 Other persistent atrial fibrillation: Secondary | ICD-10-CM | POA: Diagnosis not present

## 2023-03-27 DIAGNOSIS — D6869 Other thrombophilia: Secondary | ICD-10-CM

## 2023-03-27 DIAGNOSIS — I493 Ventricular premature depolarization: Secondary | ICD-10-CM | POA: Diagnosis not present

## 2023-03-27 NOTE — Telephone Encounter (Signed)
 DR. Orest Bio SLEEP STUDY.   Pt was in the office today seeing Creighton Doffing, NP and pt said she has been having a hard time trying to reach the person that she says keeps calling her to set up the study.  I was not made aware of the pt ever needing to be set up.I set up the study today. It looks like maybe the pt was approved from the note I see from Calvin Caulk, [facility] 02/06/23. To be sure I will send a note to the sleep pool to check if authorization is still good. If so I will call the pt with the PIN#.   Patient agreement reviewed and signed on 03/27/2023.  WatchPAT issued to patient on 03/27/2023 by Alec Huntington, CMA. Patient aware to not open the WatchPAT box until contacted with the activation PIN. Patient profile initialized in CloudPAT on 03/27/2023 by Alec Huntington, CMA. Device serial number: [id number]  Please list Reason for Call as Advice Only and type "WatchPAT issued to patient" in the comment box.

## 2023-03-27 NOTE — Patient Instructions (Addendum)
 Medication Instructions:  Your physician recommends that you continue on your current medications as directed. Please refer to the Current Medication list given to you today.  *If you need a refill on your cardiac medications before your next appointment, please call your pharmacy*  Lab Work: None ordered If you have labs (blood work) drawn today and your tests are completely normal, you will receive your results only by: MyChart Message (if you have MyChart) OR A paper copy in the mail If you have any lab test that is abnormal or we need to change your treatment, we will call you to review the results.  Follow-Up: At [facility], you and your health needs are our priority.  As part of our continuing mission to provide you with exceptional heart care, we have created designated Provider Care Teams.  These Care Teams include your primary Cardiologist (physician) and Advanced Practice Providers (APPs -  Physician Assistants and Nurse Practitioners) who all work together to provide you with the care you need, when you need it.  Your next appointment:   6 month(s)  Provider:   Loman Brooklyn, MD or Canary Brim, NP

## 2023-03-30 ENCOUNTER — Telehealth: Payer: Self-pay | Admitting: *Deleted

## 2023-03-30 NOTE — Telephone Encounter (Signed)
Left message with PIN# [id number] for DPR pt' son Arlys John. Left message for a call back to confirm message was received.

## 2023-03-30 NOTE — Telephone Encounter (Signed)
-----   Message from Nurse Clemon Chambers sent at 03/30/2023  8:43 AM EST ----- Regarding: RE: Donnie Coffin SET UP 03/27/23; CHECK ON AUTH Yes her PA is still good ----- Message ----- From: Tarri Fuller, CMA Sent: 03/27/2023   5:17 PM EST To: Tarri Fuller, CMA; Cv Div Sleep Studies Subject: Donnie Coffin SET UP 03/27/23; CHECK ON AUTH           DR. Erven Colla SLEEP STUDY.   Pt was in the office today seeing Canary Brim, NP and pt said she has been having a hard time trying to reach the person that she says keeps calling her to set up the study.  I was not made aware of the pt ever needing to be set up.I set up the study today. It looks like maybe the pt was approved from the note I see from Johnnye Sima, [facility] 02/06/23. To be sure I will send a note to the sleep pool to check if authorization is still good. If so I will call the pt with the PIN#.   1. Patient agreement reviewed and signed on 03/27/2023.  2. WatchPAT issued to patient on 03/27/2023 by Danielle Rankin, CMA. Patient aware to not open the WatchPAT box until contacted with the activation PIN. 3. Patient profile initialized in CloudPAT on 03/27/2023 by Danielle Rankin, CMA. 4. Device serial number: [id number]  Please list Reason for Call as Advice Only and type "WatchPAT issued to patient" in the comment box.

## 2023-05-12 DIAGNOSIS — M169 Osteoarthritis of hip, unspecified: Secondary | ICD-10-CM | POA: Diagnosis not present

## 2023-05-12 DIAGNOSIS — I4819 Other persistent atrial fibrillation: Secondary | ICD-10-CM | POA: Diagnosis not present

## 2023-05-12 DIAGNOSIS — N390 Urinary tract infection, site not specified: Secondary | ICD-10-CM | POA: Diagnosis not present

## 2023-05-12 DIAGNOSIS — M179 Osteoarthritis of knee, unspecified: Secondary | ICD-10-CM | POA: Diagnosis not present

## 2023-05-12 DIAGNOSIS — Z9181 History of falling: Secondary | ICD-10-CM | POA: Diagnosis not present

## 2023-05-12 DIAGNOSIS — F325 Major depressive disorder, single episode, in full remission: Secondary | ICD-10-CM | POA: Diagnosis not present

## 2023-05-12 DIAGNOSIS — R269 Unspecified abnormalities of gait and mobility: Secondary | ICD-10-CM | POA: Diagnosis not present

## 2023-05-12 DIAGNOSIS — F5104 Psychophysiologic insomnia: Secondary | ICD-10-CM | POA: Diagnosis not present

## 2023-05-12 DIAGNOSIS — I10 Essential (primary) hypertension: Secondary | ICD-10-CM | POA: Diagnosis not present

## 2023-05-22 ENCOUNTER — Other Ambulatory Visit: Payer: Self-pay | Admitting: Cardiology

## 2023-05-25 ENCOUNTER — Telehealth: Payer: Self-pay | Admitting: *Deleted

## 2023-05-25 NOTE — Telephone Encounter (Signed)
-----   Message from Nurse Clemon Chambers sent at 03/30/2023  8:43 AM EST ----- Regarding: RE: Donnie Coffin SET UP 03/27/23; CHECK ON AUTH Yes her PA is still good ----- Message ----- From: Tarri Fuller, CMA Sent: 03/27/2023   5:17 PM EST To: Tarri Fuller, CMA; Cv Div Sleep Studies Subject: Donnie Coffin SET UP 03/27/23; CHECK ON AUTH           DR. Erven Colla SLEEP STUDY.   Pt was in the office today seeing Canary Brim, NP and pt said she has been having a hard time trying to reach the person that she says keeps calling her to set up the study.  I was not made aware of the pt ever needing to be set up.I set up the study today. It looks like maybe the pt was approved from the note I see from Johnnye Sima, [facility] 02/06/23. To be sure I will send a note to the sleep pool to check if authorization is still good. If so I will call the pt with the PIN#.   1. Patient agreement reviewed and signed on 03/27/2023.  2. WatchPAT issued to patient on 03/27/2023 by Danielle Rankin, CMA. Patient aware to not open the WatchPAT box until contacted with the activation PIN. 3. Patient profile initialized in CloudPAT on 03/27/2023 by Danielle Rankin, CMA. 4. Device serial number: [id number]  Please list Reason for Call as Advice Only and type "WatchPAT issued to patient" in the comment box.

## 2023-05-25 NOTE — Telephone Encounter (Signed)
 I s/w the pt and she has the [place]amar device but says her son is going to help her and he has been really busy she said. She stated that she will see if he can help to get this done maybe by next week.

## 2023-06-19 ENCOUNTER — Other Ambulatory Visit: Payer: Self-pay | Admitting: Cardiology

## 2023-07-07 DIAGNOSIS — J3089 Other allergic rhinitis: Secondary | ICD-10-CM | POA: Diagnosis not present

## 2023-07-07 DIAGNOSIS — I10 Essential (primary) hypertension: Secondary | ICD-10-CM | POA: Diagnosis not present

## 2023-07-07 DIAGNOSIS — M169 Osteoarthritis of hip, unspecified: Secondary | ICD-10-CM | POA: Diagnosis not present

## 2023-07-07 DIAGNOSIS — M179 Osteoarthritis of knee, unspecified: Secondary | ICD-10-CM | POA: Diagnosis not present

## 2023-07-07 DIAGNOSIS — Z1331 Encounter for screening for depression: Secondary | ICD-10-CM | POA: Diagnosis not present

## 2023-07-07 DIAGNOSIS — Z0000 Encounter for general adult medical examination without abnormal findings: Secondary | ICD-10-CM | POA: Diagnosis not present

## 2023-07-07 DIAGNOSIS — F325 Major depressive disorder, single episode, in full remission: Secondary | ICD-10-CM | POA: Diagnosis not present

## 2023-07-07 DIAGNOSIS — E7800 Pure hypercholesterolemia, unspecified: Secondary | ICD-10-CM | POA: Diagnosis not present

## 2023-07-07 DIAGNOSIS — D126 Benign neoplasm of colon, unspecified: Secondary | ICD-10-CM | POA: Diagnosis not present

## 2023-07-07 DIAGNOSIS — N390 Urinary tract infection, site not specified: Secondary | ICD-10-CM | POA: Diagnosis not present

## 2023-07-11 ENCOUNTER — Other Ambulatory Visit: Payer: Self-pay | Admitting: Cardiology

## 2023-07-13 DIAGNOSIS — J3089 Other allergic rhinitis: Secondary | ICD-10-CM | POA: Diagnosis not present

## 2023-07-13 DIAGNOSIS — F5104 Psychophysiologic insomnia: Secondary | ICD-10-CM | POA: Diagnosis not present

## 2023-07-13 DIAGNOSIS — F325 Major depressive disorder, single episode, in full remission: Secondary | ICD-10-CM | POA: Diagnosis not present

## 2023-07-13 DIAGNOSIS — M170 Bilateral primary osteoarthritis of knee: Secondary | ICD-10-CM | POA: Diagnosis not present

## 2023-07-13 DIAGNOSIS — I4819 Other persistent atrial fibrillation: Secondary | ICD-10-CM | POA: Diagnosis not present

## 2023-07-13 DIAGNOSIS — I10 Essential (primary) hypertension: Secondary | ICD-10-CM | POA: Diagnosis not present

## 2023-07-13 DIAGNOSIS — D6869 Other thrombophilia: Secondary | ICD-10-CM | POA: Diagnosis not present

## 2023-07-13 DIAGNOSIS — M4726 Other spondylosis with radiculopathy, lumbar region: Secondary | ICD-10-CM | POA: Diagnosis not present

## 2023-07-13 DIAGNOSIS — E7800 Pure hypercholesterolemia, unspecified: Secondary | ICD-10-CM | POA: Diagnosis not present

## 2023-07-26 DIAGNOSIS — I4819 Other persistent atrial fibrillation: Secondary | ICD-10-CM | POA: Diagnosis not present

## 2023-07-26 DIAGNOSIS — F325 Major depressive disorder, single episode, in full remission: Secondary | ICD-10-CM | POA: Diagnosis not present

## 2023-07-26 DIAGNOSIS — M170 Bilateral primary osteoarthritis of knee: Secondary | ICD-10-CM | POA: Diagnosis not present

## 2023-07-26 DIAGNOSIS — D6869 Other thrombophilia: Secondary | ICD-10-CM | POA: Diagnosis not present

## 2023-07-26 DIAGNOSIS — M4726 Other spondylosis with radiculopathy, lumbar region: Secondary | ICD-10-CM | POA: Diagnosis not present

## 2023-07-26 DIAGNOSIS — J3089 Other allergic rhinitis: Secondary | ICD-10-CM | POA: Diagnosis not present

## 2023-07-26 DIAGNOSIS — E7800 Pure hypercholesterolemia, unspecified: Secondary | ICD-10-CM | POA: Diagnosis not present

## 2023-07-26 DIAGNOSIS — I10 Essential (primary) hypertension: Secondary | ICD-10-CM | POA: Diagnosis not present

## 2023-07-26 DIAGNOSIS — F5104 Psychophysiologic insomnia: Secondary | ICD-10-CM | POA: Diagnosis not present

## 2023-07-27 ENCOUNTER — Other Ambulatory Visit: Payer: Self-pay | Admitting: Cardiology

## 2023-07-27 DIAGNOSIS — D6869 Other thrombophilia: Secondary | ICD-10-CM | POA: Diagnosis not present

## 2023-07-27 DIAGNOSIS — F325 Major depressive disorder, single episode, in full remission: Secondary | ICD-10-CM | POA: Diagnosis not present

## 2023-07-27 DIAGNOSIS — I4819 Other persistent atrial fibrillation: Secondary | ICD-10-CM

## 2023-07-27 DIAGNOSIS — E7800 Pure hypercholesterolemia, unspecified: Secondary | ICD-10-CM | POA: Diagnosis not present

## 2023-07-27 DIAGNOSIS — M170 Bilateral primary osteoarthritis of knee: Secondary | ICD-10-CM | POA: Diagnosis not present

## 2023-07-27 DIAGNOSIS — I10 Essential (primary) hypertension: Secondary | ICD-10-CM | POA: Diagnosis not present

## 2023-07-27 DIAGNOSIS — F5104 Psychophysiologic insomnia: Secondary | ICD-10-CM | POA: Diagnosis not present

## 2023-07-27 DIAGNOSIS — M4726 Other spondylosis with radiculopathy, lumbar region: Secondary | ICD-10-CM | POA: Diagnosis not present

## 2023-07-27 DIAGNOSIS — J3089 Other allergic rhinitis: Secondary | ICD-10-CM | POA: Diagnosis not present

## 2023-07-27 NOTE — Telephone Encounter (Signed)
 Prescription refill request for Eliquis  received. Indication: afib  Last office visit: Ollis 03/27/2023 Scr: 0.93, 10/21/2022 Age: 83 yo  Weight: 83.1 kg   Refill sent.

## 2023-08-08 DIAGNOSIS — F325 Major depressive disorder, single episode, in full remission: Secondary | ICD-10-CM | POA: Diagnosis not present

## 2023-08-08 DIAGNOSIS — F5104 Psychophysiologic insomnia: Secondary | ICD-10-CM | POA: Diagnosis not present

## 2023-08-08 DIAGNOSIS — M4726 Other spondylosis with radiculopathy, lumbar region: Secondary | ICD-10-CM | POA: Diagnosis not present

## 2023-08-08 DIAGNOSIS — M170 Bilateral primary osteoarthritis of knee: Secondary | ICD-10-CM | POA: Diagnosis not present

## 2023-08-08 DIAGNOSIS — I10 Essential (primary) hypertension: Secondary | ICD-10-CM | POA: Diagnosis not present

## 2023-08-08 DIAGNOSIS — J3089 Other allergic rhinitis: Secondary | ICD-10-CM | POA: Diagnosis not present

## 2023-08-08 DIAGNOSIS — D6869 Other thrombophilia: Secondary | ICD-10-CM | POA: Diagnosis not present

## 2023-08-08 DIAGNOSIS — I4819 Other persistent atrial fibrillation: Secondary | ICD-10-CM | POA: Diagnosis not present

## 2023-08-08 DIAGNOSIS — E7800 Pure hypercholesterolemia, unspecified: Secondary | ICD-10-CM | POA: Diagnosis not present

## 2023-08-11 DIAGNOSIS — I10 Essential (primary) hypertension: Secondary | ICD-10-CM | POA: Diagnosis not present

## 2023-08-11 DIAGNOSIS — E7800 Pure hypercholesterolemia, unspecified: Secondary | ICD-10-CM | POA: Diagnosis not present

## 2023-08-11 DIAGNOSIS — J3089 Other allergic rhinitis: Secondary | ICD-10-CM | POA: Diagnosis not present

## 2023-08-11 DIAGNOSIS — I4819 Other persistent atrial fibrillation: Secondary | ICD-10-CM | POA: Diagnosis not present

## 2023-08-11 DIAGNOSIS — F325 Major depressive disorder, single episode, in full remission: Secondary | ICD-10-CM | POA: Diagnosis not present

## 2023-08-11 DIAGNOSIS — F5104 Psychophysiologic insomnia: Secondary | ICD-10-CM | POA: Diagnosis not present

## 2023-08-11 DIAGNOSIS — M4726 Other spondylosis with radiculopathy, lumbar region: Secondary | ICD-10-CM | POA: Diagnosis not present

## 2023-08-11 DIAGNOSIS — M170 Bilateral primary osteoarthritis of knee: Secondary | ICD-10-CM | POA: Diagnosis not present

## 2023-08-11 DIAGNOSIS — D6869 Other thrombophilia: Secondary | ICD-10-CM | POA: Diagnosis not present

## 2023-08-12 DIAGNOSIS — D6869 Other thrombophilia: Secondary | ICD-10-CM | POA: Diagnosis not present

## 2023-08-12 DIAGNOSIS — F325 Major depressive disorder, single episode, in full remission: Secondary | ICD-10-CM | POA: Diagnosis not present

## 2023-08-12 DIAGNOSIS — F5104 Psychophysiologic insomnia: Secondary | ICD-10-CM | POA: Diagnosis not present

## 2023-08-12 DIAGNOSIS — M4726 Other spondylosis with radiculopathy, lumbar region: Secondary | ICD-10-CM | POA: Diagnosis not present

## 2023-08-12 DIAGNOSIS — I4819 Other persistent atrial fibrillation: Secondary | ICD-10-CM | POA: Diagnosis not present

## 2023-08-12 DIAGNOSIS — I10 Essential (primary) hypertension: Secondary | ICD-10-CM | POA: Diagnosis not present

## 2023-08-12 DIAGNOSIS — E7800 Pure hypercholesterolemia, unspecified: Secondary | ICD-10-CM | POA: Diagnosis not present

## 2023-08-12 DIAGNOSIS — M170 Bilateral primary osteoarthritis of knee: Secondary | ICD-10-CM | POA: Diagnosis not present

## 2023-08-12 DIAGNOSIS — J3089 Other allergic rhinitis: Secondary | ICD-10-CM | POA: Diagnosis not present

## 2023-08-15 DIAGNOSIS — M4726 Other spondylosis with radiculopathy, lumbar region: Secondary | ICD-10-CM | POA: Diagnosis not present

## 2023-08-15 DIAGNOSIS — F5104 Psychophysiologic insomnia: Secondary | ICD-10-CM | POA: Diagnosis not present

## 2023-08-15 DIAGNOSIS — I10 Essential (primary) hypertension: Secondary | ICD-10-CM | POA: Diagnosis not present

## 2023-08-15 DIAGNOSIS — I4819 Other persistent atrial fibrillation: Secondary | ICD-10-CM | POA: Diagnosis not present

## 2023-08-15 DIAGNOSIS — J3089 Other allergic rhinitis: Secondary | ICD-10-CM | POA: Diagnosis not present

## 2023-08-15 DIAGNOSIS — E7800 Pure hypercholesterolemia, unspecified: Secondary | ICD-10-CM | POA: Diagnosis not present

## 2023-08-15 DIAGNOSIS — D6869 Other thrombophilia: Secondary | ICD-10-CM | POA: Diagnosis not present

## 2023-08-15 DIAGNOSIS — M170 Bilateral primary osteoarthritis of knee: Secondary | ICD-10-CM | POA: Diagnosis not present

## 2023-08-15 DIAGNOSIS — F325 Major depressive disorder, single episode, in full remission: Secondary | ICD-10-CM | POA: Diagnosis not present

## 2023-08-17 DIAGNOSIS — D6869 Other thrombophilia: Secondary | ICD-10-CM | POA: Diagnosis not present

## 2023-08-17 DIAGNOSIS — F325 Major depressive disorder, single episode, in full remission: Secondary | ICD-10-CM | POA: Diagnosis not present

## 2023-08-17 DIAGNOSIS — M4726 Other spondylosis with radiculopathy, lumbar region: Secondary | ICD-10-CM | POA: Diagnosis not present

## 2023-08-17 DIAGNOSIS — J3089 Other allergic rhinitis: Secondary | ICD-10-CM | POA: Diagnosis not present

## 2023-08-17 DIAGNOSIS — F5104 Psychophysiologic insomnia: Secondary | ICD-10-CM | POA: Diagnosis not present

## 2023-08-17 DIAGNOSIS — I10 Essential (primary) hypertension: Secondary | ICD-10-CM | POA: Diagnosis not present

## 2023-08-17 DIAGNOSIS — E7800 Pure hypercholesterolemia, unspecified: Secondary | ICD-10-CM | POA: Diagnosis not present

## 2023-08-17 DIAGNOSIS — M170 Bilateral primary osteoarthritis of knee: Secondary | ICD-10-CM | POA: Diagnosis not present

## 2023-08-17 DIAGNOSIS — I4819 Other persistent atrial fibrillation: Secondary | ICD-10-CM | POA: Diagnosis not present

## 2023-08-22 DIAGNOSIS — I4819 Other persistent atrial fibrillation: Secondary | ICD-10-CM | POA: Diagnosis not present

## 2023-08-22 DIAGNOSIS — J3089 Other allergic rhinitis: Secondary | ICD-10-CM | POA: Diagnosis not present

## 2023-08-22 DIAGNOSIS — M4726 Other spondylosis with radiculopathy, lumbar region: Secondary | ICD-10-CM | POA: Diagnosis not present

## 2023-08-22 DIAGNOSIS — D6869 Other thrombophilia: Secondary | ICD-10-CM | POA: Diagnosis not present

## 2023-08-22 DIAGNOSIS — E7800 Pure hypercholesterolemia, unspecified: Secondary | ICD-10-CM | POA: Diagnosis not present

## 2023-08-22 DIAGNOSIS — I10 Essential (primary) hypertension: Secondary | ICD-10-CM | POA: Diagnosis not present

## 2023-08-22 DIAGNOSIS — M170 Bilateral primary osteoarthritis of knee: Secondary | ICD-10-CM | POA: Diagnosis not present

## 2023-08-22 DIAGNOSIS — F5104 Psychophysiologic insomnia: Secondary | ICD-10-CM | POA: Diagnosis not present

## 2023-08-22 DIAGNOSIS — F325 Major depressive disorder, single episode, in full remission: Secondary | ICD-10-CM | POA: Diagnosis not present

## 2023-08-24 DIAGNOSIS — F325 Major depressive disorder, single episode, in full remission: Secondary | ICD-10-CM | POA: Diagnosis not present

## 2023-08-24 DIAGNOSIS — F5104 Psychophysiologic insomnia: Secondary | ICD-10-CM | POA: Diagnosis not present

## 2023-08-24 DIAGNOSIS — M4726 Other spondylosis with radiculopathy, lumbar region: Secondary | ICD-10-CM | POA: Diagnosis not present

## 2023-08-24 DIAGNOSIS — I10 Essential (primary) hypertension: Secondary | ICD-10-CM | POA: Diagnosis not present

## 2023-08-24 DIAGNOSIS — D6869 Other thrombophilia: Secondary | ICD-10-CM | POA: Diagnosis not present

## 2023-08-24 DIAGNOSIS — I4819 Other persistent atrial fibrillation: Secondary | ICD-10-CM | POA: Diagnosis not present

## 2023-08-24 DIAGNOSIS — J3089 Other allergic rhinitis: Secondary | ICD-10-CM | POA: Diagnosis not present

## 2023-08-24 DIAGNOSIS — E7800 Pure hypercholesterolemia, unspecified: Secondary | ICD-10-CM | POA: Diagnosis not present

## 2023-08-24 DIAGNOSIS — M170 Bilateral primary osteoarthritis of knee: Secondary | ICD-10-CM | POA: Diagnosis not present

## 2023-08-29 DIAGNOSIS — J3089 Other allergic rhinitis: Secondary | ICD-10-CM | POA: Diagnosis not present

## 2023-08-29 DIAGNOSIS — M4726 Other spondylosis with radiculopathy, lumbar region: Secondary | ICD-10-CM | POA: Diagnosis not present

## 2023-08-29 DIAGNOSIS — I10 Essential (primary) hypertension: Secondary | ICD-10-CM | POA: Diagnosis not present

## 2023-08-29 DIAGNOSIS — D6869 Other thrombophilia: Secondary | ICD-10-CM | POA: Diagnosis not present

## 2023-08-29 DIAGNOSIS — F325 Major depressive disorder, single episode, in full remission: Secondary | ICD-10-CM | POA: Diagnosis not present

## 2023-08-29 DIAGNOSIS — E7800 Pure hypercholesterolemia, unspecified: Secondary | ICD-10-CM | POA: Diagnosis not present

## 2023-08-29 DIAGNOSIS — F5104 Psychophysiologic insomnia: Secondary | ICD-10-CM | POA: Diagnosis not present

## 2023-08-29 DIAGNOSIS — M170 Bilateral primary osteoarthritis of knee: Secondary | ICD-10-CM | POA: Diagnosis not present

## 2023-08-29 DIAGNOSIS — I4819 Other persistent atrial fibrillation: Secondary | ICD-10-CM | POA: Diagnosis not present

## 2023-08-31 DIAGNOSIS — F325 Major depressive disorder, single episode, in full remission: Secondary | ICD-10-CM | POA: Diagnosis not present

## 2023-08-31 DIAGNOSIS — M170 Bilateral primary osteoarthritis of knee: Secondary | ICD-10-CM | POA: Diagnosis not present

## 2023-08-31 DIAGNOSIS — I4819 Other persistent atrial fibrillation: Secondary | ICD-10-CM | POA: Diagnosis not present

## 2023-08-31 DIAGNOSIS — F5104 Psychophysiologic insomnia: Secondary | ICD-10-CM | POA: Diagnosis not present

## 2023-08-31 DIAGNOSIS — E7800 Pure hypercholesterolemia, unspecified: Secondary | ICD-10-CM | POA: Diagnosis not present

## 2023-08-31 DIAGNOSIS — J3089 Other allergic rhinitis: Secondary | ICD-10-CM | POA: Diagnosis not present

## 2023-08-31 DIAGNOSIS — D6869 Other thrombophilia: Secondary | ICD-10-CM | POA: Diagnosis not present

## 2023-08-31 DIAGNOSIS — M4726 Other spondylosis with radiculopathy, lumbar region: Secondary | ICD-10-CM | POA: Diagnosis not present

## 2023-08-31 DIAGNOSIS — I10 Essential (primary) hypertension: Secondary | ICD-10-CM | POA: Diagnosis not present

## 2023-09-07 DIAGNOSIS — E7800 Pure hypercholesterolemia, unspecified: Secondary | ICD-10-CM | POA: Diagnosis not present

## 2023-09-07 DIAGNOSIS — I10 Essential (primary) hypertension: Secondary | ICD-10-CM | POA: Diagnosis not present

## 2023-09-07 DIAGNOSIS — D6869 Other thrombophilia: Secondary | ICD-10-CM | POA: Diagnosis not present

## 2023-09-07 DIAGNOSIS — I4819 Other persistent atrial fibrillation: Secondary | ICD-10-CM | POA: Diagnosis not present

## 2023-09-07 DIAGNOSIS — F325 Major depressive disorder, single episode, in full remission: Secondary | ICD-10-CM | POA: Diagnosis not present

## 2023-09-07 DIAGNOSIS — J3089 Other allergic rhinitis: Secondary | ICD-10-CM | POA: Diagnosis not present

## 2023-09-07 DIAGNOSIS — F5104 Psychophysiologic insomnia: Secondary | ICD-10-CM | POA: Diagnosis not present

## 2023-09-07 DIAGNOSIS — M4726 Other spondylosis with radiculopathy, lumbar region: Secondary | ICD-10-CM | POA: Diagnosis not present

## 2023-09-07 DIAGNOSIS — M170 Bilateral primary osteoarthritis of knee: Secondary | ICD-10-CM | POA: Diagnosis not present

## 2023-10-09 ENCOUNTER — Other Ambulatory Visit: Payer: Self-pay | Admitting: Cardiology

## 2023-10-12 ENCOUNTER — Other Ambulatory Visit: Payer: Self-pay | Admitting: Cardiology

## 2023-10-26 DIAGNOSIS — H43813 Vitreous degeneration, bilateral: Secondary | ICD-10-CM | POA: Diagnosis not present

## 2023-10-26 DIAGNOSIS — H16223 Keratoconjunctivitis sicca, not specified as Sjogren's, bilateral: Secondary | ICD-10-CM | POA: Diagnosis not present

## 2024-02-01 ENCOUNTER — Other Ambulatory Visit: Payer: Self-pay | Admitting: Cardiology

## 2024-02-01 DIAGNOSIS — I4819 Other persistent atrial fibrillation: Secondary | ICD-10-CM

## 2024-02-01 NOTE — Telephone Encounter (Signed)
 Prescription refill request for Eliquis  received. Indication:afib Last office visit:2/25 Scr: 1.11  2025 Age:83 Weight:83.1  kg  Prescription refilled

## 2024-02-13 ENCOUNTER — Telehealth (HOSPITAL_BASED_OUTPATIENT_CLINIC_OR_DEPARTMENT_OTHER): Payer: Self-pay | Admitting: *Deleted

## 2024-02-13 NOTE — Telephone Encounter (Signed)
" ° ° °  Primary Cardiologist: None  Chart reviewed as part of pre-operative protocol coverage. Simple dental extractions are considered low risk procedures per guidelines and generally do not require any specific cardiac clearance. It is also generally accepted that for simple extractions and dental cleanings, there is no need to interrupt blood thinner therapy.   SBE prophylaxis is not required for the patient.  I will route this recommendation to the requesting party via Epic fax function and remove from pre-op pool.  Please call with questions.  Orren LOISE Fabry, PA-C 02/13/2024, 11:09 AM     "

## 2024-02-13 NOTE — Telephone Encounter (Signed)
 I called the dental office and confirmed procedure.      Pre-operative Risk Assessment    Patient Name: [name]  DOB: [birth year] MRN: [id number]   Date of last office visit: 03/27/23 DAPHNE BARRACK, NP Date of next office visit: NONE  Request for Surgical Clearance    Procedure:  Dental Extraction - Amount of Teeth to be Pulled:  1 TOOTH FOR SIMPLE EXTRACTION  Date of Surgery:  Clearance TBD                                Surgeon:  DR. ALEX, DDS Surgeon's Group or Practice Name:  [facility] Phone number:  [phone] Fax number:  [phone] ATTN: [name]    Type of Clearance Requested:   - Medical  - Pharmacy:  Hold [facility]  (Eliquis )     Type of Anesthesia:  Local    Additional requests/questions:    [name]   02/13/2024, 10:22 AM
